# Patient Record
Sex: Male | Born: 1980 | Race: White | Hispanic: No | Marital: Married | Smoking: Never smoker
Health system: Southern US, Community
[De-identification: ages and names within clinical notes are randomized; demographics above are authoritative.]

## PROBLEM LIST (undated history)

## (undated) DIAGNOSIS — J309 Allergic rhinitis, unspecified: Secondary | ICD-10-CM

## (undated) DIAGNOSIS — L989 Disorder of the skin and subcutaneous tissue, unspecified: Secondary | ICD-10-CM

## (undated) DIAGNOSIS — L814 Other melanin hyperpigmentation: Secondary | ICD-10-CM

## (undated) DIAGNOSIS — R002 Palpitations: Secondary | ICD-10-CM

## (undated) DIAGNOSIS — K219 Gastro-esophageal reflux disease without esophagitis: Secondary | ICD-10-CM

## (undated) DIAGNOSIS — D229 Melanocytic nevi, unspecified: Secondary | ICD-10-CM

## (undated) DIAGNOSIS — I493 Ventricular premature depolarization: Secondary | ICD-10-CM

## (undated) HISTORY — PX: NASAL ENDOSCOPY: SHX286

## (undated) HISTORY — DX: Palpitations: R00.2

## (undated) HISTORY — DX: Melanocytic nevi, unspecified: D22.9

## (undated) HISTORY — DX: Disorder of the skin and subcutaneous tissue, unspecified: L98.9

## (undated) HISTORY — PX: APPENDECTOMY: SHX54

## (undated) HISTORY — PX: OTHER SURGICAL HISTORY: SHX169

## (undated) HISTORY — DX: Allergic rhinitis, unspecified: J30.9

## (undated) HISTORY — PX: WISDOM TOOTH EXTRACTION: SHX21

## (undated) HISTORY — DX: Other melanin hyperpigmentation: L81.4

## (undated) HISTORY — DX: Ventricular premature depolarization: I49.3

## (undated) HISTORY — DX: Gastro-esophageal reflux disease without esophagitis: K21.9

---

## 2003-11-08 ENCOUNTER — Encounter: Admission: RE | Admit: 2003-11-08 | Discharge: 2003-11-08 | Payer: Self-pay | Admitting: Family Medicine

## 2004-04-25 ENCOUNTER — Ambulatory Visit: Payer: Self-pay | Admitting: Family Medicine

## 2004-12-06 ENCOUNTER — Ambulatory Visit: Payer: Self-pay | Admitting: Family Medicine

## 2005-04-24 ENCOUNTER — Ambulatory Visit: Payer: Self-pay | Admitting: Family Medicine

## 2006-12-16 ENCOUNTER — Encounter: Payer: Self-pay | Admitting: Family Medicine

## 2007-12-08 ENCOUNTER — Encounter: Payer: Self-pay | Admitting: Family Medicine

## 2007-12-08 ENCOUNTER — Ambulatory Visit: Payer: Self-pay | Admitting: Family Medicine

## 2007-12-08 DIAGNOSIS — L989 Disorder of the skin and subcutaneous tissue, unspecified: Secondary | ICD-10-CM | POA: Insufficient documentation

## 2007-12-08 DIAGNOSIS — D239 Other benign neoplasm of skin, unspecified: Secondary | ICD-10-CM | POA: Insufficient documentation

## 2007-12-08 DIAGNOSIS — L819 Disorder of pigmentation, unspecified: Secondary | ICD-10-CM | POA: Insufficient documentation

## 2007-12-08 DIAGNOSIS — K219 Gastro-esophageal reflux disease without esophagitis: Secondary | ICD-10-CM | POA: Insufficient documentation

## 2010-03-20 NOTE — Assessment & Plan Note (Signed)
Summary: mole removal/mhf   Vital Signs:  Patient Profile:   30 Years Old Male Weight:      166 pounds Temp:     98.2 degrees F oral BP sitting:   102 / 72  (left arm) Cuff size:   regular  Vitals Entered By: Alfred Levins, CMA (December 08, 2007 2:56 PM)                 Chief Complaint:  2 moles removed from back.  History of Present Illness: Here for me to excise 2 moles on his back. These have been present for years but they seem to be getting a bit larger. No symptoms.     Current Allergies (reviewed today): No known allergies   Past Medical History:    Reviewed history and no changes required:       GERD       hyperhydrosis  Past Surgical History:    Reviewed history and no changes required:       Denies surgical history   Family History:    Reviewed history and no changes required:       bladder cancer  Social History:    Reviewed history and no changes required:       Married       Never Smoked       Alcohol use-no   Risk Factors:  Tobacco use:  never Alcohol use:  no   Review of Systems  The patient denies anorexia, fever, weight loss, weight gain, vision loss, decreased hearing, hoarseness, chest pain, syncope, dyspnea on exertion, peripheral edema, prolonged cough, headaches, hemoptysis, abdominal pain, melena, hematochezia, severe indigestion/heartburn, hematuria, incontinence, genital sores, muscle weakness, suspicious skin lesions, transient blindness, difficulty walking, depression, unusual weight change, abnormal bleeding, enlarged lymph nodes, angioedema, breast masses, and testicular masses.     Physical Exam  General:     Well-developed,well-nourished,in no acute distress; alert,appropriate and cooperative throughout examination Skin:     the 2 lesions in question are both in the center of the upper back. One  of these measures  about 1.8 cm in diameter and  is papular and dark brown, appears to be a nevus. The other is macular and  speckled with light brown or dark brown areas and measures about 0.9 cm in diameter.    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 Two times a day   Patient Instructions: 1)  After informed consent was obtained, these were cleansed with Betadine. LA was achieved using 2% xylocaine with epinephrine. An elliptical incision was made around both lesions down to the subcutaneous fat, using a scalpel. Both were then removed and sent to pathology. Both were closed with 3 sutures each of 4-0 Ethilon. Both were dressed with Neosporin and gauze.  He will remove the sutures in 2 weeks.   Prescriptions: RANITIDINE HCL 150 MG TABS (RANITIDINE HCL) two times a day  #60 x 11   Entered and Authorized by:   Nelwyn Salisbury MD   Signed by:   Nelwyn Salisbury MD on 12/08/2007   Method used:   Electronically to        [facility] [id number] (retail)       [phone]  [address] [place], [place]  [address]       Ph: [phone]       Fax: [phone]   RxID:   [id number]  ]

## 2010-03-20 NOTE — Miscellaneous (Signed)
Summary: Consent to Special Procedure-Mole Removal  Consent to Special Procedure-Mole Removal   Imported By: Maryln Gottron 12/14/2007 14:21:02  _____________________________________________________________________  External Attachment:    Type:   Image     Comment:   External Document

## 2011-12-17 ENCOUNTER — Other Ambulatory Visit: Payer: Self-pay | Admitting: Internal Medicine

## 2011-12-17 DIAGNOSIS — N509 Disorder of male genital organs, unspecified: Secondary | ICD-10-CM

## 2011-12-18 ENCOUNTER — Ambulatory Visit
Admission: RE | Admit: 2011-12-18 | Discharge: 2011-12-18 | Disposition: A | Discharge status: Home / Self Care | Payer: Managed Care, Other (non HMO) | Source: Ambulatory Visit | Attending: Internal Medicine | Admitting: Internal Medicine

## 2011-12-18 DIAGNOSIS — N509 Disorder of male genital organs, unspecified: Secondary | ICD-10-CM

## 2012-12-30 ENCOUNTER — Telehealth: Payer: Self-pay

## 2012-12-31 ENCOUNTER — Other Ambulatory Visit: Payer: Self-pay | Admitting: Cardiology

## 2012-12-31 MED ORDER — NEBIVOLOL HCL 5 MG PO TABS: 5.0000 mg | ORAL_TABLET | Freq: Every day | ORAL | Status: DC

## 2013-01-10 NOTE — Telephone Encounter (Signed)
Refill

## 2013-04-11 ENCOUNTER — Other Ambulatory Visit: Payer: Self-pay | Admitting: Cardiology

## 2013-04-11 MED ORDER — NEBIVOLOL HCL 5 MG PO TABS: 5.0000 mg | ORAL_TABLET | Freq: Every day | ORAL | Status: DC

## 2013-08-12 ENCOUNTER — Encounter: Payer: Self-pay | Admitting: Cardiology

## 2013-09-09 ENCOUNTER — Ambulatory Visit: Payer: Managed Care, Other (non HMO) | Admitting: Cardiology

## 2013-09-19 ENCOUNTER — Other Ambulatory Visit: Payer: Self-pay | Admitting: Dermatology

## 2013-10-13 ENCOUNTER — Encounter: Payer: Self-pay | Admitting: Cardiology

## 2013-10-13 ENCOUNTER — Ambulatory Visit (INDEPENDENT_AMBULATORY_CARE_PROVIDER_SITE_OTHER): Payer: 59 | Admitting: Cardiology

## 2013-10-13 VITALS — BP 130/78 | HR 61 | Ht 66.0 in | Wt 168.0 lb

## 2013-10-13 DIAGNOSIS — I4949 Other premature depolarization: Secondary | ICD-10-CM

## 2013-10-13 DIAGNOSIS — I493 Ventricular premature depolarization: Secondary | ICD-10-CM

## 2013-10-13 DIAGNOSIS — R002 Palpitations: Secondary | ICD-10-CM

## 2013-10-13 NOTE — Progress Notes (Signed)
      [place]. [address], [address] [place], [place]  [address] Phone: [phone] Fax:  [phone]  Date:  10/13/2013   ID:  Gabriel Frye, DOB 20-Apr-1980, MRN [id number]  PCP:  Wenda Low, MD   History of Present Illness: Gabriel Frye is a 33 y.o. male with palpitations, exercise treadmill test demonstrated PVCs, echocardiogram showing normal EF, low normal BP. He did not tolerate metoprolol well due to hypotension. Atenolol had a similar effect as well as fatigue. Bystolic 5mg , originally started at 2.5 mg , is helping significantly with symptoms. Palpitations still can occur at the onset of exercise and during recovery but he has not experienced any high-risk symptoms such as syncope during peak exercise.  08/13/11-doing very well. His palpitations have diminished. Noting them at times with heavy exertion. Bystolic 2.5 mg doing well.   09/10/12 - overall has had a set back at the beach, vagal-like, reeling in shark pulling Palpitations, pauses irratic. Felt bad. Dehydrated. ?Hyperven. Always has been under situations like this. At end of ETT, BP tanked and he was symptomatic.   10/13/13-doing well, no complaints. No palpitations. Very rarely feels that with increased stress. Mild increase shortness of breath at times. Overall doing well.    Wt Readings from Last 3 Encounters:  10/13/13 168 lb (76.204 kg)  12/08/07 166 lb (75.297 kg)     No past medical history on file.  No past surgical history on file.  Current Outpatient Prescriptions  Medication Sig Dispense Refill  . nebivolol (BYSTOLIC) 5 MG tablet Take 1 tablet (5 mg total) by mouth daily.  90 tablet  3   No current facility-administered medications for this visit.    Allergies:   No Known Allergies  Social History:  The patient  reports that he has never smoked. He does not have any smokeless tobacco history on file.   No family history on file.  ROS:  Please see the history of present illness.   Denies any  fevers, chills, orthopnea, PND   All other systems reviewed and negative.   PHYSICAL EXAM: VS:  BP 130/78  Pulse 61  Ht 5\' 6"  (1.676 m)  Wt 168 lb (76.204 kg)  BMI 27.13 kg/m2 Well nourished, well developed, in no acute distress HEENT: normal, [place]/AT, EOMI Neck: no JVD, normal carotid upstroke, no bruit Cardiac:  normal S1, S2; RRR; no murmur Lungs:  clear to auscultation bilaterally, no wheezing, rhonchi or rales Abd: soft, nontender, no hepatomegaly, no bruits Ext: no edema, 2+ distal pulses Skin: warm and dry GU: deferred Neuro: no focal abnormalities noted, AAO x 3  EKG:  10/13/13- sinus rhythm, 61, no other abnormalities, normal intervals.  ASSESSMENT AND PLAN:  1. PVCs/palpitations-overall doing well, currently on Bystolic. No change in therapy.  Signed, Candee Furbish, MD [facility]  10/13/2013 2:14 PM

## 2013-10-13 NOTE — Patient Instructions (Signed)
The current medical regimen is effective;  continue present plan and medications.  Follow up in 1 year with Dr Skains.  You will receive a letter in the mail 2 months before you are due.  Please call us when you receive this letter to schedule your follow up appointment.  

## 2013-11-10 ENCOUNTER — Other Ambulatory Visit: Payer: Self-pay | Admitting: *Deleted

## 2013-11-10 MED ORDER — NEBIVOLOL HCL 5 MG PO TABS: 5.0000 mg | ORAL_TABLET | Freq: Every day | ORAL | Status: DC

## 2014-02-03 ENCOUNTER — Other Ambulatory Visit: Payer: Self-pay | Admitting: *Deleted

## 2014-02-03 MED ORDER — NEBIVOLOL HCL 5 MG PO TABS: 5.0000 mg | ORAL_TABLET | Freq: Every day | ORAL | Status: DC

## 2014-10-25 DIAGNOSIS — L989 Disorder of the skin and subcutaneous tissue, unspecified: Secondary | ICD-10-CM | POA: Insufficient documentation

## 2014-10-25 DIAGNOSIS — L814 Other melanin hyperpigmentation: Secondary | ICD-10-CM | POA: Insufficient documentation

## 2014-10-25 DIAGNOSIS — K219 Gastro-esophageal reflux disease without esophagitis: Secondary | ICD-10-CM | POA: Insufficient documentation

## 2014-10-25 DIAGNOSIS — J309 Allergic rhinitis, unspecified: Secondary | ICD-10-CM | POA: Insufficient documentation

## 2014-10-25 DIAGNOSIS — D229 Melanocytic nevi, unspecified: Secondary | ICD-10-CM | POA: Insufficient documentation

## 2014-10-25 DIAGNOSIS — I493 Ventricular premature depolarization: Secondary | ICD-10-CM | POA: Insufficient documentation

## 2014-10-25 DIAGNOSIS — R002 Palpitations: Secondary | ICD-10-CM | POA: Insufficient documentation

## 2014-10-27 ENCOUNTER — Ambulatory Visit (INDEPENDENT_AMBULATORY_CARE_PROVIDER_SITE_OTHER): Payer: BLUE CROSS/BLUE SHIELD | Admitting: Cardiology

## 2014-10-27 ENCOUNTER — Encounter: Payer: Self-pay | Admitting: Cardiology

## 2014-10-27 VITALS — BP 110/70 | HR 53 | Ht 66.0 in | Wt 163.0 lb

## 2014-10-27 DIAGNOSIS — I493 Ventricular premature depolarization: Secondary | ICD-10-CM | POA: Insufficient documentation

## 2014-10-27 DIAGNOSIS — R002 Palpitations: Secondary | ICD-10-CM | POA: Diagnosis not present

## 2014-10-27 NOTE — Progress Notes (Signed)
[place]. [address], [address] [place], [place]  [address] Phone: [phone] Fax:  [phone]  Date:  10/27/2014   ID:  Gabriel Frye, DOB 1980-03-29, MRN [id number]  PCP:  Wenda Low, MD   History of Present Illness: Gabriel Frye is a 34 y.o. male with palpitations, exercise treadmill test demonstrated PVCs, echocardiogram showing normal EF, low normal BP. He did not tolerate metoprolol well due to hypotension. Atenolol had a similar effect as well as fatigue. Bystolic 5mg , originally started at 2.5 mg , is helping significantly with symptoms. Palpitations still can occur at the onset of exercise and during recovery but he has not experienced any high-risk symptoms such as syncope during peak exercise.  08/13/11-doing very well. His palpitations have diminished. Noting them at times with heavy exertion. Bystolic 2.5 mg doing well.   09/10/12 - overall has had a set back at the beach, vagal-like, reeling in shark pulling Palpitations, pauses irratic. Felt bad. Dehydrated. ?Hyperven. Always has been under situations like this. At end of ETT, BP tanked and he was symptomatic.   10/13/13-doing well, no complaints. No palpitations. Very rarely feels that with increased stress. Mild increase shortness of breath at times. Overall doing well.   10/27/14-Quad pain, labrum, weight loss. Not feeling it with exercise. PVC when sitting. Overall doing well. About to go on trip to [place].  Wt Readings from Last 3 Encounters:  10/27/14 163 lb (73.936 kg)  10/13/13 168 lb (76.204 kg)  12/08/07 166 lb (75.297 kg)     Past Medical History  Diagnosis Date  . GERD (gastroesophageal reflux disease)   . Lentigo   . Atypical nevus   . Skin lesions   . Palpitations   . PVC (premature ventricular contraction)   . Allergic rhinitis     No past surgical history on file.  Current Outpatient Prescriptions  Medication Sig Dispense Refill  . nebivolol (BYSTOLIC) 5 MG tablet Take 1 tablet (5 mg  total) by mouth daily. 90 tablet 3   No current facility-administered medications for this visit.    Allergies:   No Known Allergies  Social History:  The patient  reports that he has never smoked. He does not have any smokeless tobacco history on file.   Family History  Problem Relation Age of Onset  . Cancer - Colon Father 51  . Heart attack Maternal Grandfather 1    D/C  . CAD Maternal Grandfather   . Heart disease Paternal Grandfather   . CAD Paternal Grandfather   . Epilepsy Father     ROS:  Please see the history of present illness.   Denies any fevers, chills, orthopnea, PND   All other systems reviewed and negative.   PHYSICAL EXAM: VS:  BP 110/70 mmHg  Pulse 53  Ht 5\' 6"  (1.676 m)  Wt 163 lb (73.936 kg)  BMI 26.32 kg/m2 Well nourished, well developed, in no acute distress HEENT: normal, [place]/AT, EOMI Neck: no JVD, normal carotid upstroke, no bruit Cardiac:  normal S1, S2; RRR; no murmur Lungs:  clear to auscultation bilaterally, no wheezing, rhonchi or rales Abd: soft, nontender, no hepatomegaly, no bruits Ext: no edema, 2+ distal pulses Skin: warm and dry GU: deferred Neuro: no focal abnormalities noted, AAO x 3  EKG: Today 10/27/14-sinus bradycardia rate 53 with no other abnormalities. Personally viewed 10/13/13- sinus rhythm, 61, no other abnormalities, normal intervals.  ASSESSMENT AND PLAN:  1. PVCs/palpitations-overall doing well, currently on Bystolic. No change in  therapy.  Signed, Candee Furbish, MD [facility]  10/27/2014 10:02 AM

## 2014-10-27 NOTE — Patient Instructions (Signed)
Medication Instructions:  The current medical regimen is effective;  continue present plan and medications.  Follow-Up: Follow up in 1 year with Dr. Skains.  You will receive a letter in the mail 2 months before you are due.  Please call us when you receive this letter to schedule your follow up appointment.  Thank you for choosing [facility]!!     

## 2014-10-30 ENCOUNTER — Encounter: Payer: Self-pay | Admitting: Cardiology

## 2014-11-08 ENCOUNTER — Encounter: Payer: Self-pay | Admitting: Cardiology

## 2014-11-14 NOTE — Addendum Note (Signed)
Addended by: Freada Bergeron on: 11/14/2014 03:09 PM   Modules accepted: Orders

## 2015-02-18 ENCOUNTER — Other Ambulatory Visit: Payer: Self-pay | Admitting: Cardiology

## 2015-05-11 ENCOUNTER — Other Ambulatory Visit: Payer: Self-pay | Admitting: *Deleted

## 2015-05-11 ENCOUNTER — Telehealth: Payer: Self-pay | Admitting: *Deleted

## 2015-05-11 MED ORDER — OSELTAMIVIR PHOSPHATE 75 MG PO CAPS: 75.0000 mg | ORAL_CAPSULE | Freq: Every day | ORAL | Status: DC

## 2015-05-11 NOTE — Telephone Encounter (Signed)
Pt called in and spoke with Dr Marlou Porch.  He is running a fever ot 102, aching and h/a.  Has had sick contacts with influenza A.  Rx sent into pharmacy for Tamiflu 75 mg a day for 10 days as ordered by Dr Marlou Porch.

## 2015-11-08 DIAGNOSIS — Z0000 Encounter for general adult medical examination without abnormal findings: Secondary | ICD-10-CM | POA: Diagnosis not present

## 2015-11-08 DIAGNOSIS — Z23 Encounter for immunization: Secondary | ICD-10-CM | POA: Diagnosis not present

## 2015-11-08 DIAGNOSIS — Z1389 Encounter for screening for other disorder: Secondary | ICD-10-CM | POA: Diagnosis not present

## 2015-11-12 ENCOUNTER — Ambulatory Visit (INDEPENDENT_AMBULATORY_CARE_PROVIDER_SITE_OTHER): Payer: BLUE CROSS/BLUE SHIELD | Admitting: Cardiology

## 2015-11-12 ENCOUNTER — Encounter (INDEPENDENT_AMBULATORY_CARE_PROVIDER_SITE_OTHER): Payer: Self-pay

## 2015-11-12 ENCOUNTER — Encounter: Payer: Self-pay | Admitting: Cardiology

## 2015-11-12 VITALS — BP 118/80 | HR 58 | Ht 66.0 in | Wt 169.0 lb

## 2015-11-12 DIAGNOSIS — R002 Palpitations: Secondary | ICD-10-CM

## 2015-11-12 DIAGNOSIS — I493 Ventricular premature depolarization: Secondary | ICD-10-CM

## 2015-11-12 MED ORDER — NEBIVOLOL HCL 5 MG PO TABS: 5.0000 mg | ORAL_TABLET | Freq: Every day | ORAL | 11 refills | Status: DC

## 2015-11-12 NOTE — Progress Notes (Signed)
[place]. [address] [place], [place]  [address] Phone: [phone] Fax:  [phone]  Date:  11/12/2015   ID:  Gabriel Frye, DOB Mar 31, 1980, MRN [id number]  PCP:  Wenda Low, MD   History of Present Illness: Gabriel Frye is a 35 y.o. male with palpitations, exercise treadmill test demonstrated PVCs, echocardiogram showing normal EF, low normal BP. He did not tolerate metoprolol well due to hypotension. Atenolol had a similar effect as well as fatigue. Bystolic 5mg , originally started at 2.5 mg , is helping significantly with symptoms. Palpitations still can occur at the onset of exercise and during recovery but he has not experienced any high-risk symptoms such as syncope during peak exercise.  08/13/11-doing very well. His palpitations have diminished. Noting them at times with heavy exertion. Bystolic 2.5 mg doing well.   09/10/12 - overall has had a set back at the beach, vagal-like, reeling in shark pulling Palpitations, pauses irratic. Felt bad. Dehydrated. ?Hyperven. Always has been under situations like this. At end of ETT, BP tanked and he was symptomatic.   10/13/13-doing well, no complaints. No palpitations. Very rarely feels that with increased stress. Mild increase shortness of breath at times. Overall doing well.   10/27/14-Quad pain, labrum, weight loss. Not feeling it with exercise. PVC when sitting. Overall doing well. About to go on trip to [place].  11/12/15-overall doing well. Enjoyed [place], [employer]. He is going to do the transit clinic from [place] to [place]. No significant palpitations. Occasionally cyclical as discussed before. No syncope, no bleeding, no orthopnea, no PND. The proximal with his medication.  Wt Readings from Last 3 Encounters:  11/12/15 169 lb (76.7 kg)  10/27/14 163 lb (73.9 kg)  10/13/13 168 lb (76.2 kg)     Past Medical History:  Diagnosis Date  . Allergic rhinitis   . Atypical nevus   . GERD (gastroesophageal reflux  disease)   . Lentigo   . Palpitations   . PVC (premature ventricular contraction)   . Skin lesions     No past surgical history on file.  Current Outpatient Prescriptions  Medication Sig Dispense Refill  . BYSTOLIC 5 MG tablet TAKE 1 TABLET (5 MG TOTAL) BY MOUTH DAILY. 90 tablet 2   No current facility-administered medications for this visit.     Allergies:   No Known Allergies  Social History:  The patient  reports that he has never smoked. He does not have any smokeless tobacco history on file.   Family History  Problem Relation Age of Onset  . Cancer - Colon Father 82  . Heart attack Maternal Grandfather 54    D/C  . CAD Maternal Grandfather   . Heart disease Paternal Grandfather   . CAD Paternal Grandfather   . Epilepsy Father     ROS:  Please see the history of present illness.   Denies any fevers, chills, orthopnea, PND   All other systems reviewed and negative.   PHYSICAL EXAM: VS:  BP 118/80   Pulse (!) 58   Ht 5\' 6"  (1.676 m)   Wt 169 lb (76.7 kg)   BMI 27.28 kg/m  Well nourished, well developed, in no acute distress  HEENT: normal, [place]/AT, EOMI Neck: no JVD, normal carotid upstroke, no bruit Cardiac:  normal S1, S2; RRR; no murmur  Lungs:  clear to auscultation bilaterally, no wheezing, rhonchi or rales  Abd: soft, nontender, no hepatomegaly, no bruits  Ext: no edema, 2+ distal pulses Skin:  warm and dry  GU: deferred Neuro: no focal abnormalities noted, AAO x 3  EKG: Today 11/12/15-sinus bradycardia rate 58 with no other abnormalities. 10/27/14-sinus bradycardia rate 53 with no other abnormalities. Personally viewed 10/13/13- sinus rhythm, 61, no other abnormalities, normal intervals.  LDL 77, Tc 151, HdL 43, Tr 143. Gl 91 ASSESSMENT AND PLAN:  1. PVCs/palpitations-overall doing well, currently on Bystolic. No change in therapy. No chest pain, no syncope, no bleeding, no orthopnea.  Signed, Candee Furbish, MD [facility]  11/12/2015 9:34 AM

## 2015-11-12 NOTE — Patient Instructions (Signed)

## 2016-04-11 DIAGNOSIS — R509 Fever, unspecified: Secondary | ICD-10-CM | POA: Diagnosis not present

## 2016-04-11 DIAGNOSIS — R6883 Chills (without fever): Secondary | ICD-10-CM | POA: Diagnosis not present

## 2016-04-11 DIAGNOSIS — J029 Acute pharyngitis, unspecified: Secondary | ICD-10-CM | POA: Diagnosis not present

## 2016-11-10 ENCOUNTER — Other Ambulatory Visit: Payer: Self-pay | Admitting: Internal Medicine

## 2016-11-10 DIAGNOSIS — I493 Ventricular premature depolarization: Secondary | ICD-10-CM | POA: Diagnosis not present

## 2016-11-10 DIAGNOSIS — R1310 Dysphagia, unspecified: Secondary | ICD-10-CM | POA: Diagnosis not present

## 2016-11-10 DIAGNOSIS — Z23 Encounter for immunization: Secondary | ICD-10-CM | POA: Diagnosis not present

## 2016-11-10 DIAGNOSIS — Z0000 Encounter for general adult medical examination without abnormal findings: Secondary | ICD-10-CM | POA: Diagnosis not present

## 2016-11-10 DIAGNOSIS — J309 Allergic rhinitis, unspecified: Secondary | ICD-10-CM | POA: Diagnosis not present

## 2016-11-19 ENCOUNTER — Other Ambulatory Visit: Payer: BLUE CROSS/BLUE SHIELD

## 2016-11-20 ENCOUNTER — Ambulatory Visit
Admission: RE | Admit: 2016-11-20 | Discharge: 2016-11-20 | Disposition: A | Discharge status: Home / Self Care | Payer: BLUE CROSS/BLUE SHIELD | Source: Ambulatory Visit | Attending: Internal Medicine | Admitting: Internal Medicine

## 2016-11-20 DIAGNOSIS — R1310 Dysphagia, unspecified: Secondary | ICD-10-CM | POA: Diagnosis not present

## 2016-12-07 ENCOUNTER — Other Ambulatory Visit: Payer: Self-pay | Admitting: Cardiology

## 2016-12-15 ENCOUNTER — Other Ambulatory Visit: Payer: Self-pay | Admitting: Gastroenterology

## 2016-12-15 DIAGNOSIS — R1310 Dysphagia, unspecified: Secondary | ICD-10-CM | POA: Diagnosis not present

## 2016-12-30 ENCOUNTER — Ambulatory Visit: Payer: BLUE CROSS/BLUE SHIELD | Admitting: Cardiology

## 2017-01-26 ENCOUNTER — Encounter (HOSPITAL_COMMUNITY): Payer: Self-pay | Admitting: Emergency Medicine

## 2017-01-27 ENCOUNTER — Other Ambulatory Visit: Payer: Self-pay

## 2017-01-27 ENCOUNTER — Encounter (HOSPITAL_COMMUNITY): Payer: Self-pay | Admitting: Emergency Medicine

## 2017-01-28 ENCOUNTER — Encounter: Payer: Self-pay | Admitting: Cardiology

## 2017-01-30 ENCOUNTER — Ambulatory Visit: Payer: BLUE CROSS/BLUE SHIELD | Admitting: Cardiology

## 2017-02-02 ENCOUNTER — Other Ambulatory Visit: Payer: Self-pay | Admitting: Cardiology

## 2017-02-04 ENCOUNTER — Other Ambulatory Visit: Payer: Self-pay

## 2017-02-04 ENCOUNTER — Ambulatory Visit (HOSPITAL_COMMUNITY)
Admission: RE | Admit: 2017-02-04 | Discharge: 2017-02-04 | Disposition: A | Discharge status: Home / Self Care | Payer: BLUE CROSS/BLUE SHIELD | Source: Ambulatory Visit | Attending: Gastroenterology | Admitting: Gastroenterology

## 2017-02-04 ENCOUNTER — Ambulatory Visit (HOSPITAL_COMMUNITY): Payer: BLUE CROSS/BLUE SHIELD | Admitting: Anesthesiology

## 2017-02-04 ENCOUNTER — Encounter (HOSPITAL_COMMUNITY): Payer: Self-pay

## 2017-02-04 ENCOUNTER — Encounter (HOSPITAL_COMMUNITY)
Admission: RE | Disposition: A | Discharge status: Home / Self Care | Payer: Self-pay | Source: Ambulatory Visit | Attending: Gastroenterology

## 2017-02-04 DIAGNOSIS — K228 Other specified diseases of esophagus: Secondary | ICD-10-CM | POA: Diagnosis not present

## 2017-02-04 DIAGNOSIS — R1310 Dysphagia, unspecified: Secondary | ICD-10-CM | POA: Insufficient documentation

## 2017-02-04 DIAGNOSIS — K209 Esophagitis, unspecified: Secondary | ICD-10-CM | POA: Insufficient documentation

## 2017-02-04 DIAGNOSIS — K222 Esophageal obstruction: Secondary | ICD-10-CM | POA: Insufficient documentation

## 2017-02-04 DIAGNOSIS — K269 Duodenal ulcer, unspecified as acute or chronic, without hemorrhage or perforation: Secondary | ICD-10-CM | POA: Insufficient documentation

## 2017-02-04 HISTORY — PX: ESOPHAGOGASTRODUODENOSCOPY (EGD) WITH PROPOFOL: SHX5813

## 2017-02-04 SURGERY — ESOPHAGOGASTRODUODENOSCOPY (EGD) WITH PROPOFOL
Anesthesia: Monitor Anesthesia Care

## 2017-02-04 MED ORDER — LIDOCAINE 2% (20 MG/ML) 5 ML SYRINGE
INTRAMUSCULAR | Status: DC | PRN
  Administered 2017-02-04: 100 mg via INTRAVENOUS

## 2017-02-04 MED ORDER — GLYCOPYRROLATE 0.2 MG/ML IJ SOLN
INTRAMUSCULAR | Status: DC | PRN
  Administered 2017-02-04: 0.1 mg via INTRAVENOUS

## 2017-02-04 MED ORDER — PROPOFOL 10 MG/ML IV BOLUS
INTRAVENOUS | Status: AC
  Filled 2017-02-04: qty 40

## 2017-02-04 MED ORDER — SODIUM CHLORIDE 0.9 % IV SOLN: INTRAVENOUS | Status: DC

## 2017-02-04 MED ORDER — PROPOFOL 10 MG/ML IV BOLUS
INTRAVENOUS | Status: DC | PRN
  Administered 2017-02-04: 20 mg via INTRAVENOUS
  Administered 2017-02-04: 50 mg via INTRAVENOUS
  Administered 2017-02-04: 20 mg via INTRAVENOUS

## 2017-02-04 MED ORDER — LACTATED RINGERS IV SOLN
INTRAVENOUS | Status: DC
  Administered 2017-02-04: 09:00:00 via INTRAVENOUS

## 2017-02-04 MED ORDER — PROPOFOL 500 MG/50ML IV EMUL
INTRAVENOUS | Status: DC | PRN
  Administered 2017-02-04: 150 ug/kg/min via INTRAVENOUS

## 2017-02-04 SURGICAL SUPPLY — 15 items

## 2017-02-04 NOTE — Op Note (Signed)
[facility] Patient Name: Gabriel Frye Procedure Date: 02/04/2017 MRN: [id number] Attending MD: Arta Silence , MD Date of Birth: May 24, 1980 CSN: [id number] Age: 36 Admit Type: Outpatient Procedure:                Upper GI endoscopy Indications:              Dysphagia, Abnormal cine-esophagram Providers:                Arta Silence, MD, Elmer Ramp. Tilden Dome, RN, Elspeth Cho Tech., Technician, Heide Scales, CRNA Referring MD:              Medicines:                Monitored Anesthesia Care Complications:            No immediate complications. Estimated Blood Loss:     Estimated blood loss was minimal. Procedure:                Pre-Anesthesia Assessment:                           - Prior to the procedure, a History and Physical                            was performed, and patient medications and                            allergies were reviewed. The patient's tolerance of                            previous anesthesia was also reviewed. The risks                            and benefits of the procedure and the sedation                            options and risks were discussed with the patient.                            All questions were answered, and informed consent                            was obtained. Prior Anticoagulants: The patient has                            taken no previous anticoagulant or antiplatelet                            agents. ASA Grade Assessment: II - A patient with                            mild systemic disease. After reviewing the risks  and benefits, the patient was deemed in                            satisfactory condition to undergo the procedure.                           After obtaining informed consent, the endoscope was                            passed under direct vision. Throughout the                            procedure, the patient's blood pressure, pulse, and                         oxygen saturations were monitored continuously. The                            [id number]) scope was introduced through the                            mouth, and advanced to the second part of duodenum.                            The upper GI endoscopy was accomplished without                            difficulty. The patient tolerated the procedure                            well. Scope In: Scope Out: Findings:      A few mild benign-appearing, intrinsic stenoses were found in proximal       esophagus.      Mucosal changes including ringed esophagus, feline appearance and       longitudinal furrows were found in the mid esophagus and in the distal       esophagus. Biopsies were taken with a cold forceps for histology.      The exam of the esophagus was otherwise normal.      The entire examined stomach was normal.      A single localized erosion without bleeding was found in the duodenal       bulb.      The exam of the duodenum was otherwise normal. Impression:               - Benign-appearing esophageal stenoses, webs, in                            proximal esophagus.                           - Esophageal mucosal changes suspicious for                            eosinophilic esophagitis. Biopsied.                           -  Normal stomach.                           - Duodenal erosion without bleeding. Moderate Sedation:      None Recommendation:           - Patient has a contact number available for                            emergencies. The signs and symptoms of potential                            delayed complications were discussed with the                            patient. Return to normal activities tomorrow.                            Written discharge instructions were provided to the                            patient.                           - Discharge patient to home (via wheelchair).                           - Resume previous  diet today.                           - Continue present medications.                           - Await pathology results.                           - Return to GI clinic after studies are complete.                           - Return to referring physician as previously                            scheduled. Procedure Code(s):        --- Professional ---                           [phone], Esophagogastroduodenoscopy, flexible,                            transoral; with biopsy, single or multiple Diagnosis Code(s):        --- Professional ---                           K22.2, Esophageal obstruction                           K20.9, Esophagitis, unspecified  K26.9, Duodenal ulcer, unspecified as acute or                            chronic, without hemorrhage or perforation                           R13.10, Dysphagia, unspecified                           R93.3, Abnormal findings on diagnostic imaging of                            other parts of digestive tract CPT copyright 2016 American Medical Association. All rights reserved. The codes documented in this report are preliminary and upon coder review may  be revised to meet current compliance requirements. Arta Silence, MD 02/04/2017 9:58:18 AM This report has been signed electronically. Number of Addenda: 0

## 2017-02-04 NOTE — H&P (Signed)
Patient interval history reviewed.  Patient examined again.  There has been no change from documented H/P dated 02/03/17 (scanned into chart from our office) except as documented above.  Assessment:  1.  Dysphagia. 2.  Abnormal barium esophagram.  Plan:  1.  Endoscopy with possible dilatation or possible biopsies. 2.  Risks (bleeding, infection, bowel perforation that could require surgery, sedation-related changes in cardiopulmonary systems), benefits (identification and possible treatment of source of symptoms, exclusion of certain causes of symptoms), and alternatives (watchful waiting, radiographic imaging studies, empiric medical treatment) of upper endoscopy with possible dilatation (EGD +/- DIL) were explained to patient/family in detail and patient wishes to proceed.

## 2017-02-04 NOTE — Anesthesia Postprocedure Evaluation (Signed)
Anesthesia Post Note  Patient: Gabriel Frye  Procedure(s) Performed: ESOPHAGOGASTRODUODENOSCOPY (EGD) WITH PROPOFOL (N/A ) BALLOON DILATION (N/A )     Patient location during evaluation: PACU Anesthesia Type: MAC Level of consciousness: awake and alert Pain management: pain level controlled Vital Signs Assessment: post-procedure vital signs reviewed and stable Respiratory status: spontaneous breathing, nonlabored ventilation, respiratory function stable and patient connected to nasal cannula oxygen Cardiovascular status: stable and blood pressure returned to baseline Postop Assessment: no apparent nausea or vomiting Anesthetic complications: no    Last Vitals:  Vitals:   02/04/17 0954 02/04/17 1000  BP: (!) 90/43 (!) 97/50  Pulse: (!) 51 (!) 51  Resp: 13 19  Temp:    SpO2: 100% 100%    Last Pain:  Vitals:   02/04/17 0859  TempSrc: Oral                 Reyonna Haack S

## 2017-02-04 NOTE — Transfer of Care (Signed)
Immediate Anesthesia Transfer of Care Note  Patient: Gabriel Frye  Procedure(s) Performed: Procedure(s): ESOPHAGOGASTRODUODENOSCOPY (EGD) WITH PROPOFOL (N/A) BALLOON DILATION (N/A)  Patient Location: PACU  Anesthesia Type:MAC  Level of Consciousness: Patient easily awoken, sedated, comfortable, cooperative, following commands, responds to stimulation.   Airway & Oxygen Therapy: Patient spontaneously breathing, ventilating well, oxygen via simple oxygen mask.  Post-op Assessment: Report given to PACU RN, vital signs reviewed and stable, moving all extremities.   Post vital signs: Reviewed and stable.  Complications: No apparent anesthesia complications  Last Vitals:  Vitals:   02/04/17 0859  BP: (!) 112/56  Pulse: (!) 54  Resp: 13  Temp: 36.5 C    Last Pain:  Vitals:   02/04/17 0859  TempSrc: Oral         Complications: No apparent anesthesia complications

## 2017-02-04 NOTE — Anesthesia Preprocedure Evaluation (Signed)
Anesthesia Evaluation  Patient identified by MRN, date of birth, ID band Patient awake    Reviewed: Allergy & Precautions, NPO status , Patient's Chart, lab work & pertinent test results  Airway Mallampati: II  TM Distance: >3 FB Neck ROM: Full    Dental no notable dental hx.    Pulmonary neg pulmonary ROS,    Pulmonary exam normal breath sounds clear to auscultation       Cardiovascular negative cardio ROS Normal cardiovascular exam Rhythm:Regular Rate:Normal     Neuro/Psych negative neurological ROS  negative psych ROS   GI/Hepatic Neg liver ROS, GERD  ,  Endo/Other  negative endocrine ROS  Renal/GU negative Renal ROS  negative genitourinary   Musculoskeletal negative musculoskeletal ROS (+)   Abdominal   Peds negative pediatric ROS (+)  Hematology negative hematology ROS (+)   Anesthesia Other Findings   Reproductive/Obstetrics negative OB ROS                            Anesthesia Physical Anesthesia Plan  ASA: II  Anesthesia Plan: MAC   Post-op Pain Management:    Induction: Intravenous  PONV Risk Score and Plan: 0  Airway Management Planned: Nasal Cannula  Additional Equipment:   Intra-op Plan:   Post-operative Plan:   Informed Consent: I have reviewed the patients History and Physical, chart, labs and discussed the procedure including the risks, benefits and alternatives for the proposed anesthesia with the patient or authorized representative who has indicated his/her understanding and acceptance.   Dental advisory given  Plan Discussed with: CRNA and Surgeon  Anesthesia Plan Comments:         Anesthesia Quick Evaluation

## 2017-02-04 NOTE — Anesthesia Procedure Notes (Signed)
Procedure Name: MAC Date/Time: 02/04/2017 9:40 AM Performed by: Deliah Boston, CRNA Pre-anesthesia Checklist: Patient identified, Emergency Drugs available, Suction available, Patient being monitored and Timeout performed Patient Re-evaluated:Patient Re-evaluated prior to induction Oxygen Delivery Method: Simple face mask Preoxygenation: Pre-oxygenation with 100% oxygen Placement Confirmation: positive ETCO2 and breath sounds checked- equal and bilateral

## 2017-02-04 NOTE — Discharge Instructions (Signed)
Esophagogastroduodenoscopy, Care After °Refer to this sheet in the next few weeks. These instructions provide you with information about caring for yourself after your procedure. Your health care provider may also give you more specific instructions. Your treatment has been planned according to current medical practices, but problems sometimes occur. Call your health care provider if you have any problems or questions after your procedure. °What can I expect after the procedure? °After the procedure, it is common to have: °· A sore throat. °· Nausea. °· Bloating. °· Dizziness. °· Fatigue. ° °Follow these instructions at home: °· Do not eat or drink anything until the numbing medicine (local anesthetic) has worn off and your gag reflex has returned. You will know that the local anesthetic has worn off when you can swallow comfortably. °· Do not drive for 24 hours if you received a medicine to help you relax (sedative). °· If your health care provider took a tissue sample for testing during the procedure, make sure to get your test results. This is your responsibility. Ask your health care provider or the department performing the test when your results will be ready. °· Keep all follow-up visits as told by your health care provider. This is important. °Contact a health care provider if: °· You cannot stop coughing. °· You are not urinating. °· You are urinating less than usual. °Get help right away if: °· You have trouble swallowing. °· You cannot eat or drink. °· You have throat or chest pain that gets worse. °· You are dizzy or light-headed. °· You faint. °· You have nausea or vomiting. °· You have chills. °· You have a fever. °· You have severe abdominal pain. °· You have black, tarry, or bloody stools. °This information is not intended to replace advice given to you by your health care provider. Make sure you discuss any questions you have with your health care provider. °Document Released: 01/21/2012 Document  Revised: 07/12/2015 Document Reviewed: 12/28/2014 °Elsevier Interactive Patient Education © 2018 [employer] ° °

## 2017-02-08 ENCOUNTER — Encounter (HOSPITAL_COMMUNITY): Payer: Self-pay | Admitting: Gastroenterology

## 2017-03-05 ENCOUNTER — Telehealth: Payer: Self-pay | Admitting: Allergy and Immunology

## 2017-03-05 DIAGNOSIS — K219 Gastro-esophageal reflux disease without esophagitis: Secondary | ICD-10-CM | POA: Diagnosis not present

## 2017-03-05 DIAGNOSIS — K200 Eosinophilic esophagitis: Secondary | ICD-10-CM | POA: Diagnosis not present

## 2017-03-05 NOTE — Telephone Encounter (Signed)
Patient called and was last seen in 2011 by Dr. Neldon Mc. He has been to his GI doctor and they said her needs to come here for eosinophilic esophagitis testing. He said for a food. I asked what food, he did not know and said it should be in our records. He would like a call back to tell him about this testing. I offered to put him in as a NP and have scratch testing and he said he didn't need that, possibly just blood testing.

## 2017-03-05 NOTE — Telephone Encounter (Signed)
Called patient Left voicemail to call the office back to set up an appointment.

## 2017-03-05 NOTE — Telephone Encounter (Signed)
Please advise patient that he has not been seen since 2011 and would need a new patient appointment. At that visit he would discuss with doctor what is the best option.

## 2017-03-11 ENCOUNTER — Other Ambulatory Visit: Payer: Self-pay | Admitting: Cardiology

## 2017-03-18 ENCOUNTER — Encounter: Payer: Self-pay | Admitting: Cardiology

## 2017-03-18 ENCOUNTER — Ambulatory Visit: Payer: BLUE CROSS/BLUE SHIELD | Admitting: Cardiology

## 2017-03-18 VITALS — BP 112/66 | HR 47 | Ht 66.0 in | Wt 169.0 lb

## 2017-03-18 DIAGNOSIS — I493 Ventricular premature depolarization: Secondary | ICD-10-CM

## 2017-03-18 DIAGNOSIS — R002 Palpitations: Secondary | ICD-10-CM | POA: Diagnosis not present

## 2017-03-18 MED ORDER — NEBIVOLOL HCL 5 MG PO TABS: ORAL_TABLET | ORAL | 11 refills | Status: DC

## 2017-03-18 NOTE — Progress Notes (Signed)
[facility]. [address], [address] [place], [place]  [address] Phone: [phone] Fax:  [phone]  Date:  03/18/2017   ID:  Gabriel Frye, DOB [birth year], MRN [id number]  PCP:  Gabriel Low, MD   History of Present Illness: Gabriel Frye is a 37 y.o. male with palpitations, exercise treadmill test demonstrated PVCs, echocardiogram showing normal EF, Frye normal BP. He did not tolerate metoprolol well due to hypotension. Atenolol had a similar effect as well as fatigue. Bystolic 5mg , originally started at 2.5 mg , is helping significantly with symptoms. Palpitations still can occur at the onset of exercise and during recovery but he has not experienced any high-risk symptoms such as syncope during peak exercise.  08/13/11-doing very well. His palpitations have diminished. Noting them at times with heavy exertion. Bystolic 2.5 mg doing well.   09/10/12 - overall has had a set back at the beach, vagal-like, reeling in shark pulling Palpitations, pauses irratic. Felt bad. Dehydrated. ?Hyperven. Always has been under situations like this. At end of ETT, BP tanked and he was symptomatic.   10/13/13-doing well, no complaints. No palpitations. Very rarely feels that with increased stress. Mild increase shortness of breath at times. Overall doing well.   10/27/14-Quad pain, labrum, weight loss. Not feeling it with exercise. PVC when sitting. Overall doing well. About to go on trip to [place].  11/12/15-overall doing well. Enjoyed [place], [employer]. No significant palpitations. Occasionally cyclical as discussed before. No syncope, no bleeding, no orthopnea, no PND. The proximal with his medication.  03/18/17 -overall doing very well, no significant palpitations.  Like clockwork, sometimes in September or October he may feel a few PVCs.  May be 10 a day.  It is quite rare.  He also had had some dysphasia recently and had endoscopy performed which showed eosinophilic esophagitis possibly.  He has  eliminated soy from his diet and is going to see an allergist.  Overall this is doing okay.  No chest pain fevers chills nausea vomiting syncope.  Going on [place] cruise.  Wt Readings from Last 3 Encounters:  03/18/17 169 lb (76.7 kg)  02/04/17 169 lb (76.7 kg)  11/12/15 169 lb (76.7 kg)     Past Medical History:  Diagnosis Date  . Allergic rhinitis   . Atypical nevus   . GERD (gastroesophageal reflux disease)   . Lentigo   . Palpitations   . PVC (premature ventricular contraction)   . Skin lesions     Past Surgical History:  Procedure Laterality Date  . ESOPHAGOGASTRODUODENOSCOPY (EGD) WITH PROPOFOL N/A 02/04/2017   Procedure: ESOPHAGOGASTRODUODENOSCOPY (EGD) WITH PROPOFOL;  Surgeon: Arta Silence, MD;  Location: [facility];  Service: Endoscopy;  Laterality: N/A;    Current Outpatient Medications  Medication Sig Dispense Refill  . FLOVENT HFA 220 MCG/ACT inhaler Place 2 sprays into both nostrils 2 (two) times daily.    Marland Kitchen ibuprofen (ADVIL,MOTRIN) 200 MG tablet Take 400-600 mg by mouth every 6 (six) hours as needed for headache or moderate pain.    . nebivolol (BYSTOLIC) 5 MG tablet TAKE 1 TABLET BY MOUTH DAILY. 30 tablet 11   No current facility-administered medications for this visit.     Allergies:   No Known Allergies  Social History:  The patient  reports that  has never smoked. he has never used smokeless tobacco. He reports that he does not use drugs.   Family History  Problem Relation Age of Onset  . Cancer - Colon Father  80  . Epilepsy Father   . Heart attack Maternal Grandfather 29       D/C  . CAD Maternal Grandfather   . Heart disease Paternal Grandfather   . CAD Paternal Grandfather     ROS:  Please see the history of present illness.   Denies any fevers, chills, orthopnea, PND   All other systems reviewed and negative.   PHYSICAL EXAM: VS:  BP 112/66   Pulse (!) 47   Ht 5\' 6"  (1.676 m)   Wt 169 lb (76.7 kg)   BMI 27.28 kg/m  GEN: Well  nourished, well developed, in no acute distress  HEENT: normal  Neck: no JVD, carotid bruits, or masses Cardiac: RRR; no murmurs, rubs, or gallops,no edema  Respiratory:  clear to auscultation bilaterally, normal work of breathing GI: soft, nontender, nondistended, + BS MS: no deformity or atrophy  Skin: warm and dry, no rash Neuro:  Alert and Oriented x 3, Strength and sensation are intact Psych: euthymic mood, full affect  EKG: Today 03/18/17 - SB 47, no other abn. Personally viewed. 11/12/15-sinus bradycardia rate 58 with no other abnormalities. 10/27/14-sinus bradycardia rate 53 with no other abnormalities. Personally viewed 10/13/13- sinus rhythm, 61, no other abnormalities, normal intervals.  Echocardiogram 12/21/2008-normal EF, trace MR.  LDL 77, Tc 151, HdL 43, Tr 143. Gl 91 ASSESSMENT AND PLAN:  PVCs/palpitations-overall doing well, currently on Bystolic.  No changes in therapy needed.  Continue with diet, exercise.  Signed, Gabriel Furbish, MD [facility]  03/18/2017 9:39 AM

## 2017-03-18 NOTE — Patient Instructions (Signed)

## 2017-03-19 NOTE — Addendum Note (Signed)
Addended by: Velna Ochs on: 03/19/2017 05:33 PM   Modules accepted: Orders

## 2017-03-25 ENCOUNTER — Ambulatory Visit: Payer: Self-pay | Admitting: Allergy and Immunology

## 2017-04-02 ENCOUNTER — Ambulatory Visit (INDEPENDENT_AMBULATORY_CARE_PROVIDER_SITE_OTHER): Payer: BLUE CROSS/BLUE SHIELD | Admitting: Allergy and Immunology

## 2017-04-02 ENCOUNTER — Encounter: Payer: Self-pay | Admitting: Allergy and Immunology

## 2017-04-02 VITALS — BP 116/66 | HR 60 | Temp 98.2°F | Resp 16 | Ht 66.7 in | Wt 171.4 lb

## 2017-04-02 DIAGNOSIS — Z91018 Allergy to other foods: Secondary | ICD-10-CM | POA: Diagnosis not present

## 2017-04-02 DIAGNOSIS — K200 Eosinophilic esophagitis: Secondary | ICD-10-CM | POA: Diagnosis not present

## 2017-04-02 NOTE — Assessment & Plan Note (Addendum)
Eosinophilic esophagitis. Food allergen skin tests today were negative despite a positive histamine control.   Because of high false negative rates with cows milk, this food should be eliminated for 6-8 weeks with monitoring for symptom improvement.   A laboratory order form has been provided for serum specific IgE against food panel.  When lab results have returned the patient will be called with further recommendations and follow up instructions.  Follow-up as recommended with Dr. Paulita Fujita for endoscopic monitoring.

## 2017-04-02 NOTE — Progress Notes (Signed)
New Patient Note  RE: Gabriel Frye MRN: [id number] DOB: 1980/06/14 Date of Office Visit: 04/02/2017  Referring provider: Wenda Low, MD Primary care provider: Wenda Low, MD  Chief Complaint: Other (Eosinophilic esophagitis)   History of present illness: Gabriel Frye is a 37 y.o. male seen today in consultation requested by Wenda Low, MD.  She reports that over the past 5 years he has had guilty swallowing and frequently experienced the sensation that food gets stuck in his throat and esophagus.  This was especially problematic with meat.  The symptoms have progressed over the past year.  He had a barium swallow which was nonconclusive.  Dr. Paulita Fujita made the diagnosis of eosinophilic esophagitis by EGD with biopsies.  Approximately 3 weeks ago he was started on Flovent 220 g, 2 actuations swallowed twice daily.  His symptoms have improved, however he is concerned about using the steroid long-term.  He is here today to assess his food allergy status.   Assessment and plan: Eosinophilic esophagitis Eosinophilic esophagitis. Food allergen skin tests today were negative despite a positive histamine control.   Because of high false negative rates with cows milk, this food should be eliminated for 6-8 weeks with monitoring for symptom improvement.   A laboratory order form has been provided for serum specific IgE against food panel.  When lab results have returned the patient will be called with further recommendations and follow up instructions.  Follow-up as recommended with Dr. Paulita Fujita for endoscopic monitoring.   Diagnostics: Food allergen skin testing: Negative despite a positive histamine control.    Physical examination: Blood pressure 116/66, pulse 60, temperature 98.2 F (36.8 C), temperature source Oral, resp. rate 16, height 5' 6.7" (1.694 m), weight 171 lb 6.4 oz (77.7 kg), SpO2 97 %.  General: Alert, interactive, in no acute distress. Neck: Supple  without lymphadenopathy. Lungs: Clear to auscultation without wheezing, rhonchi or rales. CV: Normal S1, S2 without murmurs. Abdomen: Nondistended, nontender. Skin: Warm and dry, without lesions or rashes. Extremities:  No clubbing, cyanosis or edema. Neuro:   Grossly intact.  Review of systems:  Review of systems negative except as noted in HPI / PMHx or noted below: Review of Systems  Constitutional: Negative.   HENT: Negative.   Eyes: Negative.   Respiratory: Negative.   Cardiovascular: Negative.   Gastrointestinal: Negative.   Genitourinary: Negative.   Musculoskeletal: Negative.   Skin: Negative.   Neurological: Negative.   Endo/Heme/Allergies: Negative.   Psychiatric/Behavioral: Negative.     Past medical history:  Past Medical History:  Diagnosis Date  . Allergic rhinitis   . Atypical nevus   . GERD (gastroesophageal reflux disease)   . Lentigo   . Palpitations   . PVC (premature ventricular contraction)   . Skin lesions     Past surgical history:  Past Surgical History:  Procedure Laterality Date  . APPENDECTOMY    . ESOPHAGOGASTRODUODENOSCOPY (EGD) WITH PROPOFOL N/A 02/04/2017   Procedure: ESOPHAGOGASTRODUODENOSCOPY (EGD) WITH PROPOFOL;  Surgeon: Arta Silence, MD;  Location: [facility];  Service: Endoscopy;  Laterality: N/A;  . NASAL ENDOSCOPY    . septorphinoplasty    . WISDOM TOOTH EXTRACTION      Family history: Family History  Problem Relation Age of Onset  . Food Allergy Mother   . Cancer - Colon Father 62  . Epilepsy Father   . Heart attack Maternal Grandfather 22       D/C  . CAD Maternal Grandfather   . Heart disease Paternal  Grandfather   . CAD Paternal Grandfather   . Allergic rhinitis Neg Hx   . Asthma Neg Hx   . Angioedema Neg Hx   . Eczema Neg Hx   . Immunodeficiency Neg Hx   . Urticaria Neg Hx     Social history: Social History   Socioeconomic History  . Marital status: Married    Spouse name: Not on file  . Number  of children: Not on file  . Years of education: Not on file  . Highest education level: Not on file  Social Needs  . Financial resource strain: Not on file  . Food insecurity - worry: Not on file  . Food insecurity - inability: Not on file  . Transportation needs - medical: Not on file  . Transportation needs - non-medical: Not on file  Occupational History  . Not on file  Tobacco Use  . Smoking status: Never Smoker  . Smokeless tobacco: Never Used  Substance and Sexual Activity  . Alcohol use: Yes    Comment: once weekly  . Drug use: No  . Sexual activity: Yes  Other Topics Concern  . Not on file  Social History Narrative  . Not on file   Environmental History: The patient lives in a 39 year old house with carpeting in the bedroom, gas heat, and central air.  There are 2 dogs in the home which do not have access to his bedroom.  He is a non-smoker.  There is no known mold/water damage in the home.  Allergies as of 04/02/2017   No Known Allergies     Medication List        Accurate as of 04/02/17  1:41 PM. Always use your most recent med list.          FLOVENT HFA 220 MCG/ACT inhaler Generic drug:  fluticasone Place 2 sprays into both nostrils 2 (two) times daily.   ibuprofen 200 MG tablet Commonly known as:  ADVIL,MOTRIN Take 400-600 mg by mouth every 6 (six) hours as needed for headache or moderate pain.   nebivolol 5 MG tablet Commonly known as:  BYSTOLIC TAKE 1 TABLET BY MOUTH DAILY.       Known medication allergies: No Known Allergies  I appreciate the opportunity to take part in Gabriel Frye's care. Please do not hesitate to contact me with questions.  Sincerely,   R. Edgar Frisk, MD

## 2017-04-02 NOTE — Patient Instructions (Addendum)
Eosinophilic esophagitis Eosinophilic esophagitis. Food allergen skin tests today were negative despite a positive histamine control.   Because of high false negative rates with cows milk, this food should be eliminated for 6-8 weeks with monitoring for symptom improvement.   A laboratory order form has been provided for serum specific IgE against food panel.  When lab results have returned the patient will be called with further recommendations and follow up instructions.  Follow-up as recommended with Dr. Paulita Fujita for endoscopic monitoring.

## 2017-04-05 LAB — FOOD ALLERGY PROFILE
Allergen Corn, IgE: <0.1 kU/L
Clam IgE: <0.1 kU/L
Codfish IgE: <0.1 kU/L
Egg White IgE: 0.21 kU/L — AB
Milk IgE: 0.45 kU/L — AB
Peanut IgE: <0.1 kU/L
Scallop IgE: <0.1 kU/L
Sesame Seed IgE: <0.1 kU/L
Shrimp IgE: <0.1 kU/L
Soybean IgE: <0.1 kU/L
Walnut IgE: <0.1 kU/L
Wheat IgE: 0.16 kU/L — AB

## 2017-04-27 DIAGNOSIS — K200 Eosinophilic esophagitis: Secondary | ICD-10-CM | POA: Diagnosis not present

## 2017-07-07 DIAGNOSIS — M25511 Pain in right shoulder: Secondary | ICD-10-CM | POA: Diagnosis not present

## 2017-08-03 ENCOUNTER — Telehealth: Payer: Self-pay

## 2017-08-03 ENCOUNTER — Other Ambulatory Visit: Payer: Self-pay

## 2017-08-03 DIAGNOSIS — R509 Fever, unspecified: Secondary | ICD-10-CM | POA: Diagnosis not present

## 2017-08-03 DIAGNOSIS — J069 Acute upper respiratory infection, unspecified: Secondary | ICD-10-CM | POA: Diagnosis not present

## 2017-08-03 MED ORDER — NEBIVOLOL HCL 5 MG PO TABS: 5.0000 mg | ORAL_TABLET | Freq: Every day | ORAL | 6 refills | Status: DC

## 2017-08-03 NOTE — Telephone Encounter (Signed)
I attempted to do a Bystolic PA through covermymeds X 3 but was unable as the system did not recognize the pts info. I then called [employer] and did a Bystolic PA over the phone with [place].  Per Sharyn Lull this PA has been approved for the lifetime of the pt unless he switches plans. Reference# [id number]  I have notified the pts pharmacy, [facility].

## 2017-09-12 DIAGNOSIS — M79644 Pain in right finger(s): Secondary | ICD-10-CM | POA: Diagnosis not present

## 2017-11-13 DIAGNOSIS — Z125 Encounter for screening for malignant neoplasm of prostate: Secondary | ICD-10-CM | POA: Diagnosis not present

## 2017-11-13 DIAGNOSIS — Z13220 Encounter for screening for lipoid disorders: Secondary | ICD-10-CM | POA: Diagnosis not present

## 2017-11-13 DIAGNOSIS — Z1389 Encounter for screening for other disorder: Secondary | ICD-10-CM | POA: Diagnosis not present

## 2017-11-13 DIAGNOSIS — Z0000 Encounter for general adult medical examination without abnormal findings: Secondary | ICD-10-CM | POA: Diagnosis not present

## 2017-11-13 DIAGNOSIS — Z23 Encounter for immunization: Secondary | ICD-10-CM | POA: Diagnosis not present

## 2018-03-18 ENCOUNTER — Ambulatory Visit: Payer: BLUE CROSS/BLUE SHIELD | Admitting: Cardiology

## 2018-03-18 ENCOUNTER — Encounter: Payer: Self-pay | Admitting: Cardiology

## 2018-03-18 VITALS — BP 114/74 | HR 58 | Ht 66.7 in | Wt 167.8 lb

## 2018-03-18 DIAGNOSIS — I493 Ventricular premature depolarization: Secondary | ICD-10-CM

## 2018-03-18 DIAGNOSIS — R002 Palpitations: Secondary | ICD-10-CM

## 2018-03-18 NOTE — Progress Notes (Signed)
Cardiology Office Note:    Date:  03/18/2018   ID:  Maralyn Sago Bessey, DOB 1980-09-16, MRN [id number]  PCP:  Wenda Low, MD  Cardiologist:  Candee Furbish, MD  Electrophysiologist:  None   Referring MD: Wenda Low, MD     History of Present Illness:    Gabriel Frye is a 38 y.o. male here for follow-up of palpitations.   exercise treadmill test demonstrated PVCs, echocardiogram showing normal EF, low normal BP. He did not tolerate metoprolol well due to hypotension. Atenolol had a similar effect as well as fatigue. Bystolic 5mg , originally started at 2.5 mg , is helping significantly with symptoms. Palpitations still can occur at the onset of exercise and during recovery but he has not experienced any high-risk symptoms such as syncope during peak exercise.  08/13/11-doing very well. His palpitations have diminished. Noting them at times with heavy exertion. Bystolic 2.5 mg doing well.   09/10/12 - overall has had a set back at the beach, vagal-like, reeling in shark pulling Palpitations, pauses irratic. Felt bad. Dehydrated. ?Hyperven. Always has been under situations like this. At end of ETT, BP tanked and he was symptomatic.   10/13/13-doing well, no complaints. No palpitations. Very rarely feels that with increased stress. Mild increase shortness of breath at times. Overall doing well.   10/27/14-Quad pain, labrum, weight loss. Not feeling it with exercise. PVC when sitting. Overall doing well. About to go on trip to [place].  11/12/15-overall doing well. 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 No significant palpitations. Occasionally cyclical as discussed before. No syncope, no bleeding, no orthopnea, no PND. The proximal with his medication.  03/18/17 -overall doing very well, no significant palpitations.  Like clockwork, sometimes in September or October he may feel a few PVCs.  May be 10 a day.  It is quite rare.  He also had had some dysphasia recently and had endoscopy performed which  showed eosinophilic esophagitis possibly.  He has eliminated soy from his diet and is going to see an allergist.  Overall this is doing okay.  No chest pain fevers chills nausea vomiting syncope.  Going on [place] cruise.  03/18/2018- here for the follow-up of palpitations.  Doing quite well.  No significant symptoms.  He has been off of Bystolic for quite some time.  Doing very well.  He will take this on an as-needed basis currently.  Feels great.  Takes fluticasone occasionally for eosinophilic esophagitis.  Some swallowing difficulty at times.  Overall no chest pain fevers chills nausea vomiting syncope.  Been working out more.  Past Medical History:  Diagnosis Date  . Allergic rhinitis   . Atypical nevus   . GERD (gastroesophageal reflux disease)   . Lentigo   . Palpitations   . PVC (premature ventricular contraction)   . Skin lesions     Past Surgical History:  Procedure Laterality Date  . APPENDECTOMY    . ESOPHAGOGASTRODUODENOSCOPY (EGD) WITH PROPOFOL N/A 02/04/2017   Procedure: ESOPHAGOGASTRODUODENOSCOPY (EGD) WITH PROPOFOL;  Surgeon: Arta Silence, MD;  Location: [facility];  Service: Endoscopy;  Laterality: N/A;  . NASAL ENDOSCOPY    . septorphinoplasty    . WISDOM TOOTH EXTRACTION      Current Medications: Current Meds  Medication Sig  . FLOVENT HFA 220 MCG/ACT inhaler Place 2 sprays into both nostrils 2 (two) times daily as needed.   Marland Kitchen ibuprofen (ADVIL,MOTRIN) 200 MG tablet Take 400-600 mg by mouth every 6 (six) hours as needed for headache or moderate pain.  Allergies:   Patient has no known allergies.   Social History   Socioeconomic History  . Marital status: Married    Spouse name: Not on file  . Number of children: Not on file  . Years of education: Not on file  . Highest education level: Not on file  Occupational History  . Not on file  Social Needs  . Financial resource strain: Not on file  . Food insecurity:    Worry: Not on file     Inability: Not on file  . Transportation needs:    Medical: Not on file    Non-medical: Not on file  Tobacco Use  . Smoking status: Never Smoker  . Smokeless tobacco: Never Used  Substance and Sexual Activity  . Alcohol use: Yes    Comment: once weekly  . Drug use: No  . Sexual activity: Yes  Lifestyle  . Physical activity:    Days per week: Not on file    Minutes per session: Not on file  . Stress: Not on file  Relationships  . Social connections:    Talks on phone: Not on file    Gets together: Not on file    Attends religious service: Not on file    Active member of club or organization: Not on file    Attends meetings of clubs or organizations: Not on file    Relationship status: Not on file  Other Topics Concern  . Not on file  Social History Narrative  . Not on file     Family History: The patient's family history includes CAD in his maternal grandfather and paternal grandfather; Cancer - Colon (age of onset: 44) in his father; Epilepsy in his father; Food Allergy in his mother; Heart attack (age of onset: 21) in his maternal grandfather; Heart disease in his paternal grandfather. There is no history of Allergic rhinitis, Asthma, Angioedema, Eczema, Immunodeficiency, or Urticaria.  ROS:   Please see the history of present illness.     All other systems reviewed and are negative.  EKGs/Labs/Other Studies Reviewed:    The following studies were reviewed today: Prior office notes lab work EKG  EKG:  EKG is  ordered today.  The ekg ordered today demonstrates sinus bradycardia 58 with no other changes personally reviewed and interpreted.  Recent Labs: No results found for requested labs within last 8760 hours.  Recent Lipid Panel No results found for: CHOL, TRIG, HDL, CHOLHDL, VLDL, LDLCALC, LDLDIRECT  Physical Exam:    VS:  BP 114/74   Pulse (!) 58   Ht 5' 6.7" (1.694 m)   Wt 167 lb 12.8 oz (76.1 kg)   BMI 26.52 kg/m     Wt Readings from Last 3 Encounters:   03/18/18 167 lb 12.8 oz (76.1 kg)  04/02/17 171 lb 6.4 oz (77.7 kg)  03/18/17 169 lb (76.7 kg)     GEN:  Well nourished, well developed in no acute distress HEENT: Normal NECK: No JVD; No carotid bruits LYMPHATICS: No lymphadenopathy CARDIAC: RRR, no murmurs, rubs, gallops RESPIRATORY:  Clear to auscultation without rales, wheezing or rhonchi  ABDOMEN: Soft, non-tender, non-distended MUSCULOSKELETAL:  No edema; No deformity  SKIN: Warm and dry NEUROLOGIC:  Alert and oriented x 3 PSYCHIATRIC:  Normal affect   ASSESSMENT:    1. Palpitations   2. PVC's (premature ventricular contractions)    PLAN:    In order of problems listed above:  Palpitations -Doing very well.  Exercising more.  Has not noticed  many problems.  Taking Bystolic only on a as needed basis.  Blood pressure has been excellent.  Doing great.   Medication Adjustments/Labs and Tests Ordered: Current medicines are reviewed at length with the patient today.  Concerns regarding medicines are outlined above.  Orders Placed This Encounter  Procedures  . EKG 12-Lead   No orders of the defined types were placed in this encounter.   Patient Instructions  Medication Instructions:  The current medical regimen is effective;  continue present plan and medications.  If you need a refill on your cardiac medications before your next appointment, please call your pharmacy.   Follow-Up: At [facility], you and your health needs are our priority.  As part of our continuing mission to provide you with exceptional heart care, we have created designated Provider Care Teams.  These Care Teams include your primary Cardiologist (physician) and Advanced Practice Providers (APPs -  Physician Assistants and Nurse Practitioners) who all work together to provide you with the care you need, when you need it. You will need a follow up appointment in 2 years.  Please call our office 2 months in advance to schedule this appointment.   You may see Candee Furbish, MD or one of the following Advanced Practice Providers on your designated Care Team:   Truitt Merle, NP Cecilie Kicks, NP . Kathyrn Drown, NP  Thank you for choosing [facility]!!        Signed, Candee Furbish, MD  03/18/2018 10:31 AM    [facility]

## 2018-03-18 NOTE — Patient Instructions (Signed)
Medication Instructions:  The current medical regimen is effective;  continue present plan and medications.  If you need a refill on your cardiac medications before your next appointment, please call your pharmacy.   Follow-Up: At [facility], you and your health needs are our priority.  As part of our continuing mission to provide you with exceptional heart care, we have created designated Provider Care Teams.  These Care Teams include your primary Cardiologist (physician) and Advanced Practice Providers (APPs -  Physician Assistants and Nurse Practitioners) who all work together to provide you with the care you need, when you need it. You will need a follow up appointment in 2 years.  Please call our office 2 months in advance to schedule this appointment.  You may see Candee Furbish, MD or one of the following Advanced Practice Providers on your designated Care Team:   Truitt Merle, NP Cecilie Kicks, NP . Kathyrn Drown, NP  Thank you for choosing [facility]!!

## 2018-05-03 DIAGNOSIS — M25572 Pain in left ankle and joints of left foot: Secondary | ICD-10-CM | POA: Diagnosis not present

## 2018-09-07 DIAGNOSIS — M25572 Pain in left ankle and joints of left foot: Secondary | ICD-10-CM | POA: Diagnosis not present

## 2018-09-22 ENCOUNTER — Ambulatory Visit: Payer: BC Managed Care – PPO | Attending: Sports Medicine | Admitting: Physical Therapy

## 2018-09-22 ENCOUNTER — Other Ambulatory Visit: Payer: Self-pay

## 2018-09-22 ENCOUNTER — Encounter: Payer: Self-pay | Admitting: Physical Therapy

## 2018-09-22 DIAGNOSIS — M25572 Pain in left ankle and joints of left foot: Secondary | ICD-10-CM | POA: Diagnosis not present

## 2018-09-22 DIAGNOSIS — M25672 Stiffness of left ankle, not elsewhere classified: Secondary | ICD-10-CM | POA: Diagnosis not present

## 2018-09-22 NOTE — Therapy (Signed)
[place], [place], [address] Phone: [phone]   Fax:  [phone]  Physical Therapy Evaluation  Patient Details  Name: Gabriel Frye MRN: [id number] Date of Birth: 03/13/1980 Referring Provider (PT): kendall   Encounter Date: 09/22/2018  PT End of Session - 09/22/18 1157    Visit Number  1    Date for PT Re-Evaluation  11/22/18    PT Start Time  3212    PT Stop Time  1055    PT Time Calculation (min)  40 min    Activity Tolerance  Patient tolerated treatment well    Behavior During Therapy  [facility] for tasks assessed/performed       Past Medical History:  Diagnosis Date  . Allergic rhinitis   . Atypical nevus   . GERD (gastroesophageal reflux disease)   . Lentigo   . Palpitations   . PVC (premature ventricular contraction)   . Skin lesions     Past Surgical History:  Procedure Laterality Date  . APPENDECTOMY    . ESOPHAGOGASTRODUODENOSCOPY (EGD) WITH PROPOFOL N/A 02/04/2017   Procedure: ESOPHAGOGASTRODUODENOSCOPY (EGD) WITH PROPOFOL;  Surgeon: Arta Silence, MD;  Location: [facility];  Service: Endoscopy;  Laterality: N/A;  . NASAL ENDOSCOPY    . septorphinoplasty    . WISDOM TOOTH EXTRACTION      There were no vitals filed for this visit.   Subjective Assessment - 09/22/18 1009    Subjective  Patient reports that in March he was playing football with his daughter and jumped up and landed on the curb and sustained a serious left lateral ankle sprain, he was in a boot for 8 weeks moslty, then used an ASO brace for a while, reports that he has had difficulty with stability, flexibility, difficulty with SLS.    Diagnostic tests  x-rays negative    Patient Stated Goals  be normal, run, jump, soccer    Currently in Pain?  Yes    Pain Score  2     Pain Location  Ankle    Pain Orientation  Left    Pain Descriptors / Indicators  Tightness;Sore    Pain Type  Acute pain    Pain  Onset  More than a month ago    Pain Frequency  Intermittent    Aggravating Factors   single leg stance, has not tried to jump or run or cut    Pain Relieving Factors  pain can be 0/10 at rest and normal activites    Effect of Pain on Daily Activities  fear, unable to do recreation sports         [facility] PT Assessment - 09/22/18 0001      Assessment   Medical Diagnosis  left lateral ankle sprain    Referring Provider (PT)  kendall    Onset Date/Surgical Date  05/04/18    Prior Therapy  no      Precautions   Precautions  None      Balance Screen   Has the patient fallen in the past 6 months  No    Has the patient had a decrease in activity level because of a fear of falling?   No    Is the patient reluctant to leave their home because of a fear of falling?   No      Home Environment   Additional Comments  stairs, does some yardwork      Prior Function  Level of Independence  Independent    Vocation  Full time employment    Licensed conveyancer, mostly sitting    Leisure  soccer, running, racquestball      ROM / Strength   AROM / PROM / Strength  AROM;Strength      AROM   AROM Assessment Site  Ankle    Right/Left Ankle  Left    Left Ankle Dorsiflexion  5    Left Ankle Plantar Flexion  40    Left Ankle Inversion  20    Left Ankle Eversion  5      Strength   Overall Strength Comments  Left ankle strength 4+/5 with some pain with eversion at the lateral malleolous      Palpation   Palpation comment  mild tenderness in the left lateral area and the anterior area      Ambulation/Gait   Gait Comments  gait is mildy antalgic seems to be mostly at toe off on the left, has a little hop when descdning stairs step over step to avoid the end of DF.  Standing on the left he can do, but when I had him on a dynamic surface this was much more difficult                Objective measurements completed on examination: See above findings.               PT Education - 09/22/18 1155    Education Details  HEP for DF and eversion with Tband, gastroc and soleus stretches and SLS to work on Scientist, research (medical)) Educated  Patient    Methods  Explanation;Demonstration;Tactile cues;Verbal cues;Handout    Comprehension  Verbalized understanding          PT Long Term Goals - 09/22/18 1203      PT LONG TERM GOAL #1   Title  independent HEP    Time  8    Period  Weeks    Status  New      PT LONG TERM GOAL #2   Title  increase AROM of the left ankle to WNL's    Time  8    Period  Weeks    Status  New      PT LONG TERM GOAL #3   Title  run and cut without fear and iwthout pain    Time  8    Period  Weeks    Status  New      PT LONG TERM GOAL #4   Title  strength of the left ankle WNL's    Time  8    Period  Weeks    Status  New             Plan - 09/22/18 1157    Clinical Impression Statement  Patient with a severe left lateral ankle sprain in mid March, her wore a walking boot for about 8 weeks due to pain with weight bearing and instability.  He reports that he has improved a lot over the past month.  He reports that he still feels weak, tight and has fear of progresing to running and jumping.  He is tight in the gastroc and especially the soleus, has weakness for eversion, difficulty standing on the left leg only    Stability/Clinical Decision Making  Stable/Uncomplicated    Clinical Decision Making  Low    Rehab Potential  Good    PT Frequency  1x /  week    PT Duration  8 weeks    PT Treatment/Interventions  ADLs/Self Care Home Management;Cryotherapy;Electrical Stimulation;Iontophoresis 4mg /ml Dexamethasone;Therapeutic activities;Functional mobility training;Stair training;Gait training;Therapeutic exercise;Balance training;Neuromuscular re-education;Patient/family education;Manual techniques;Vasopneumatic Device    PT Next Visit Plan  patient is going to try todays HEP and then we will see  him in two weeks    Consulted and Agree with Plan of Care  Patient       Patient will benefit from skilled therapeutic intervention in order to improve the following deficits and impairments:  Abnormal gait, Decreased range of motion, Decreased strength, Decreased balance, Difficulty walking, Increased edema, Impaired flexibility, Pain  Visit Diagnosis: 1. Stiffness of left ankle, not elsewhere classified   2. Pain in left ankle and joints of left foot        Problem List Patient Active Problem List   Diagnosis Date Noted  . Eosinophilic esophagitis 27/08/8673  . History of food allergy 04/02/2017  . PVC's (premature ventricular contractions) 10/27/2014  . GERD (gastroesophageal reflux disease)   . Lentigo   . Atypical nevus   . Skin lesions   . Palpitations   . Allergic rhinitis   . NEVUS, ATYPICAL 12/08/2007  . LENTIGO 12/08/2007  . SKIN LESIONS, MULTIPLE 12/08/2007    Sumner Boast., PT 09/22/2018, 12:05 PM  [address] [place], [place], [address] Phone: [phone]   Fax:  [phone]  Name: Gabriel Frye MRN: [id number] Date of Birth: 01-Jul-1980

## 2018-10-06 ENCOUNTER — Other Ambulatory Visit: Payer: Self-pay

## 2018-10-06 ENCOUNTER — Ambulatory Visit: Payer: BC Managed Care – PPO | Admitting: Physical Therapy

## 2018-10-06 ENCOUNTER — Encounter: Payer: Self-pay | Admitting: Physical Therapy

## 2018-10-06 DIAGNOSIS — M25572 Pain in left ankle and joints of left foot: Secondary | ICD-10-CM

## 2018-10-06 DIAGNOSIS — M25672 Stiffness of left ankle, not elsewhere classified: Secondary | ICD-10-CM | POA: Diagnosis not present

## 2018-10-06 NOTE — Therapy (Signed)
[place], [place], [address] Phone: [phone]   Fax:  [phone]  Physical Therapy Treatment  Patient Details  Name: Gabriel Frye MRN: [id number] Date of Birth: 1980/07/18 Referring Provider (PT): kendall   Encounter Date: 10/06/2018  PT End of Session - 10/06/18 1100    Visit Number  2    Date for PT Re-Evaluation  11/22/18    PT Start Time  9169    PT Stop Time  1100    PT Time Calculation (min)  45 min    Activity Tolerance  Patient tolerated treatment well    Behavior During Therapy  [facility] for tasks assessed/performed       Past Medical History:  Diagnosis Date  . Allergic rhinitis   . Atypical nevus   . GERD (gastroesophageal reflux disease)   . Lentigo   . Palpitations   . PVC (premature ventricular contraction)   . Skin lesions     Past Surgical History:  Procedure Laterality Date  . APPENDECTOMY    . ESOPHAGOGASTRODUODENOSCOPY (EGD) WITH PROPOFOL N/A 02/04/2017   Procedure: ESOPHAGOGASTRODUODENOSCOPY (EGD) WITH PROPOFOL;  Surgeon: Arta Silence, MD;  Location: [facility];  Service: Endoscopy;  Laterality: N/A;  . NASAL ENDOSCOPY    . septorphinoplasty    . WISDOM TOOTH EXTRACTION      There were no vitals filed for this visit.  Subjective Assessment - 10/06/18 1017    Subjective  Pt reports that his ankle is getting better over all, jumped in the pool Saturday and landing on L ankle causing a jolt, pain lasted only 4 minutes.    Currently in Pain?  No/denies    Pain Score  0-No pain                       [facility] Adult PT Treatment/Exercise - 10/06/18 0001      High Level Balance   High Level Balance Comments  three way LLE rebound ball toss x 10 each with and without airex, LLE on airex with cone taps 3x3      Exercises   Exercises  Ankle      Ankle Exercises: Aerobic   Elliptical  I10 R 5 x 3 min     Recumbent Bike  L2 x5 min      Ankle  Exercises: Machines for Strengthening   Cybex Leg Press  LLE heel raises 30lb 3x15       Ankle Exercises: Plyometrics   Plyometric Exercises  Lateral hops with vectors 2x3       Ankle Exercises: Standing   Other Standing Ankle Exercises  Resisted gait 70lb 4 way x 3 each, Toe and heel raises with balck bar 2x15 each     Other Standing Ankle Exercises  Squats on Dynax disk 2x10, Walking lunges                   PT Long Term Goals - 10/06/18 1100      PT LONG TERM GOAL #1   Title  independent HEP    Status  Achieved      PT LONG TERM GOAL #2   Title  increase AROM of the left ankle to WNL's    Status  On-going      PT LONG TERM GOAL #3   Title  run and cut without fear and iwthout pain    Status  On-going  PT LONG TERM GOAL #4   Title  strength of the left ankle WNL's    Status  Partially Met            Plan - 10/06/18 1101    Clinical Impression Statement  Pt did really well with today's activities. He is very high level and reports overall improvement. He only voices concern of doing too much too soon. He did report some tightness in the L achillis area with lateral hops. Some mild inflammation in L ankle compared to R.    Stability/Clinical Decision Making  Stable/Uncomplicated    Rehab Potential  Good    PT Frequency  1x / week    PT Duration  8 weeks    PT Treatment/Interventions  ADLs/Self Care Home Management;Cryotherapy;Electrical Stimulation;Iontophoresis 4mg/ml Dexamethasone;Therapeutic activities;Functional mobility training;Stair training;Gait training;Therapeutic exercise;Balance training;Neuromuscular re-education;Patient/family education;Manual techniques;Vasopneumatic Device    PT Next Visit Plan  Pt is doing well overall managing his own care, reports that he will call if he needs anything       Patient will benefit from skilled therapeutic intervention in order to improve the following deficits and impairments:  Abnormal gait, Decreased  range of motion, Decreased strength, Decreased balance, Difficulty walking, Increased edema, Impaired flexibility, Pain  Visit Diagnosis: 1. Pain in left ankle and joints of left foot   2. Stiffness of left ankle, not elsewhere classified        Problem List Patient Active Problem List   Diagnosis Date Noted  . Eosinophilic esophagitis 04/02/2017  . History of food allergy 04/02/2017  . PVC's (premature ventricular contractions) 10/27/2014  . GERD (gastroesophageal reflux disease)   . Lentigo   . Atypical nevus   . Skin lesions   . Palpitations   . Allergic rhinitis   . NEVUS, ATYPICAL 12/08/2007  . LENTIGO 12/08/2007  . SKIN LESIONS, MULTIPLE 12/08/2007    Gabriel Frye, PTA 10/06/2018, 11:04 AM  [facility] [address] , [place], [address] Phone: [phone]   Fax:  [phone]  Name: Gabriel Frye MRN: [id number] Date of Birth: 04/27/1980   

## 2018-11-20 DIAGNOSIS — Z20828 Contact with and (suspected) exposure to other viral communicable diseases: Secondary | ICD-10-CM | POA: Diagnosis not present

## 2018-12-16 IMAGING — RF DG UGI W/ HIGH DENSITY W/KUB
6 series · 14 of 24 positions shown · non-contrast
Comparison: None in PACs

CLINICAL DATA: Intermittent dysphagia for least the past 2 years
with some increase in symptoms more recently.

EXAM:
UPPER GI SERIES WITH KUB
TECHNIQUE: After obtaining a scout radiograph a routine upper GI series was
performed using thin and high density barium. Effervescent crystals
and a barium tablet were administered.
FLUOROSCOPY TIME:  Fluoroscopy Time:  1 minutes, 5 seconds
Radiation Exposure Index (if provided by the fluoroscopic device):
337 mGy
Number of Acquired Spot Images: 13+ 1 video loop.

[Series 1: one shot · 1 of 1 slices shown (1 of 3)]
[im 1/1]
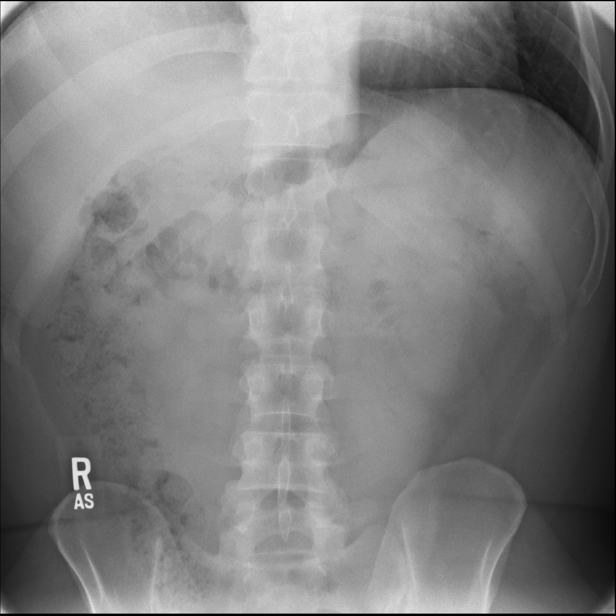

[Series 2: sequence · 2 of 71 frames shown (1 of 3)]
[frame 36/71]
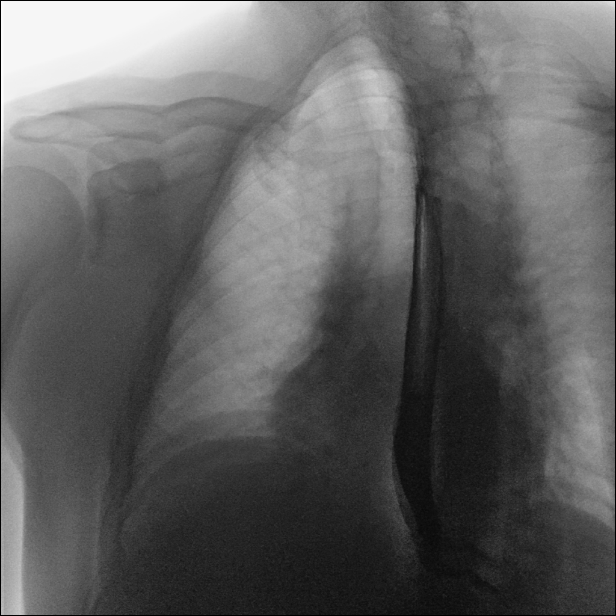
[frame 61/71]
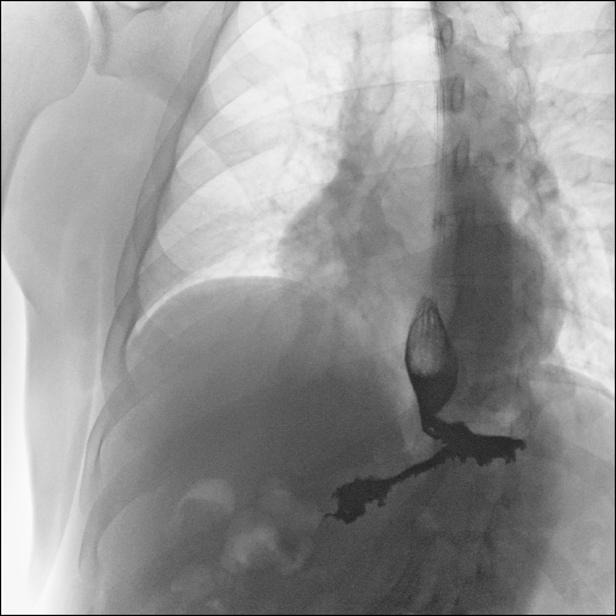

[Series 3: one shot · 1 of 2 slices shown (2 of 3)]
[im 2/2]
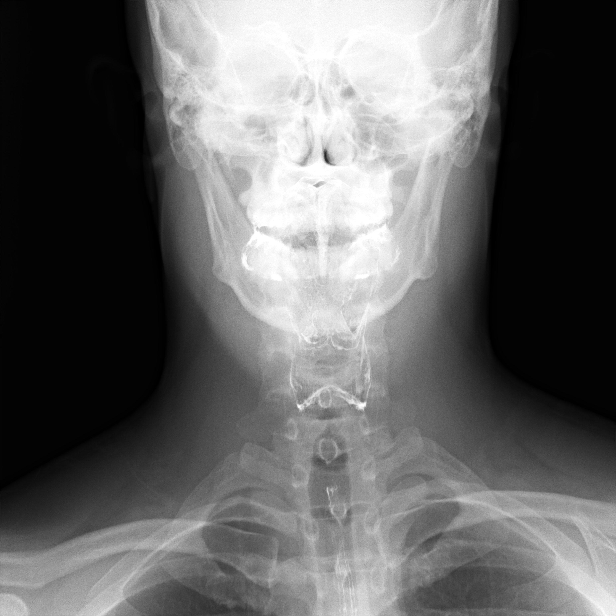

[Series 4: sequence · 2 of 81 frames shown (2 of 3)]
[frame 13/81]
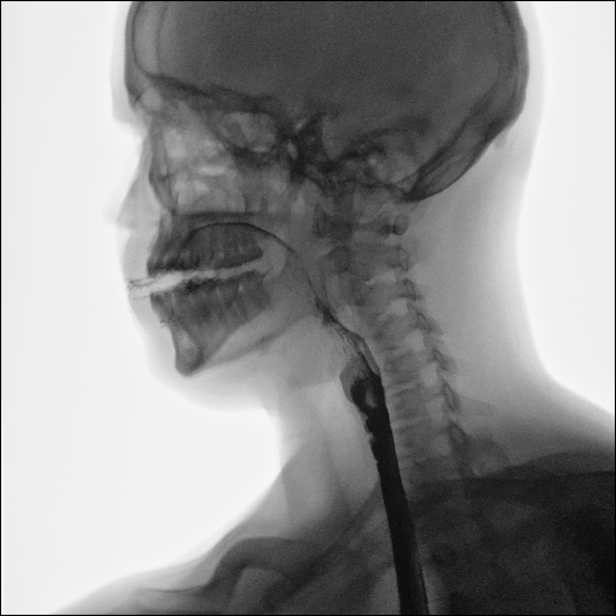
[frame 41/81]
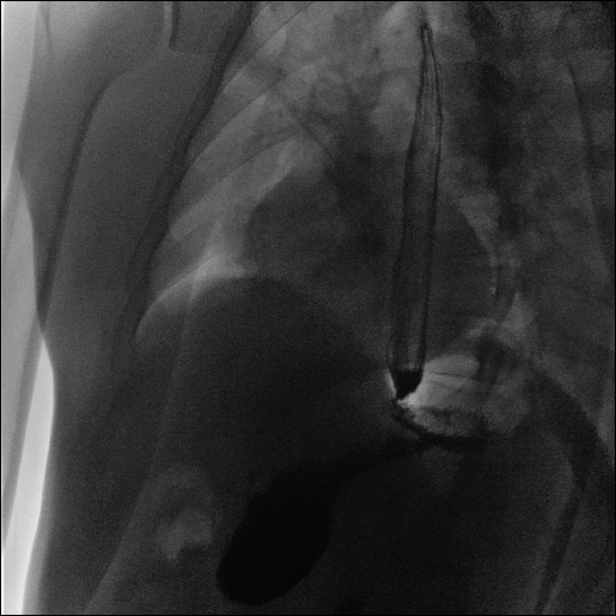

[Series 5: one shot · 6 of 10 slices shown (3 of 3)]
[im 1/10]
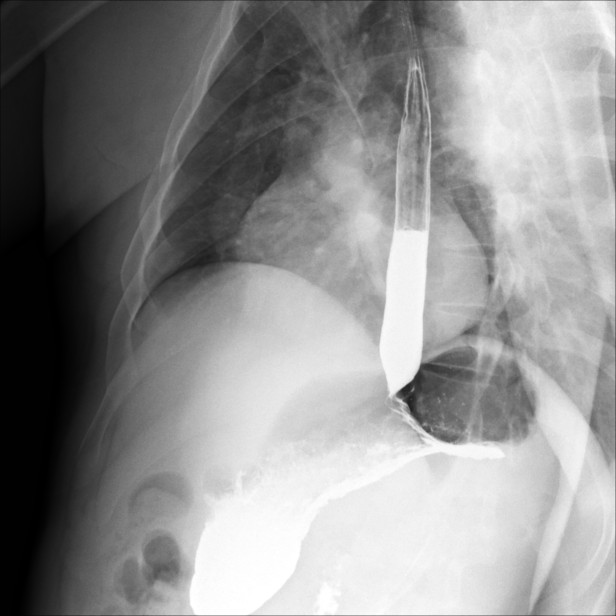
[im 3/10]
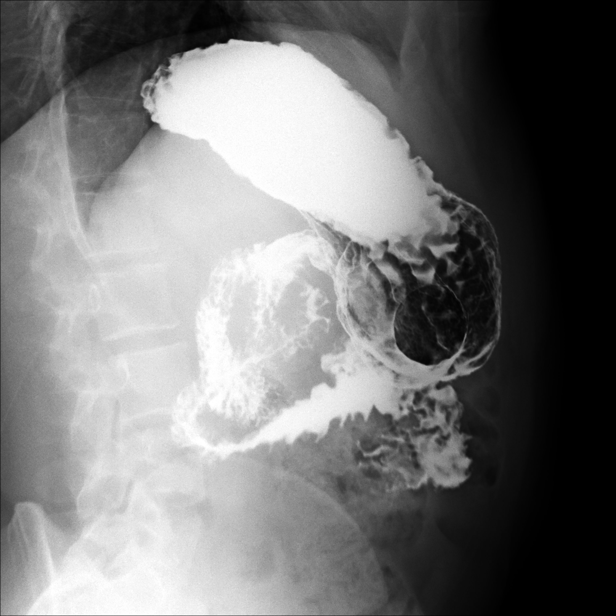
[im 5/10]
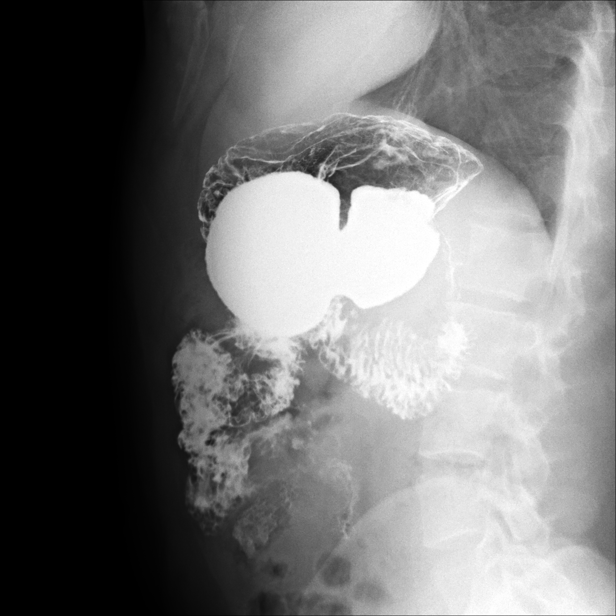
[im 7/10]
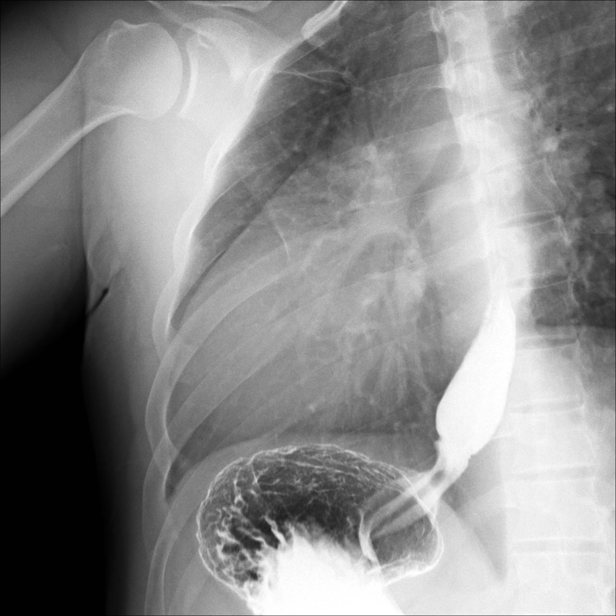
[im 9/10]
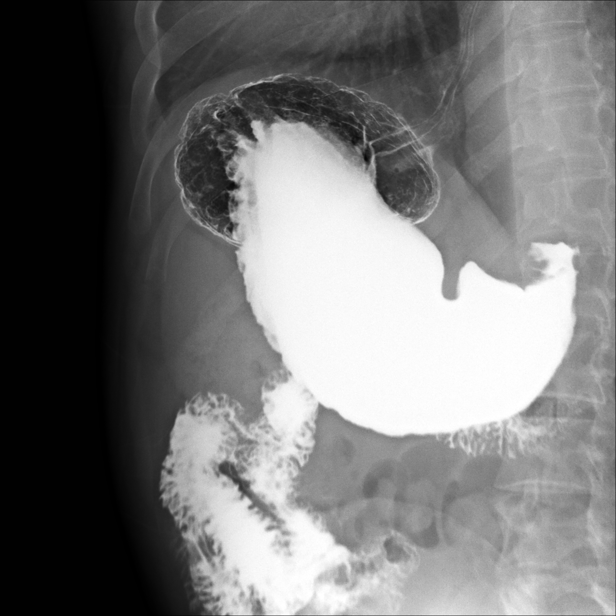
[im 10/10]
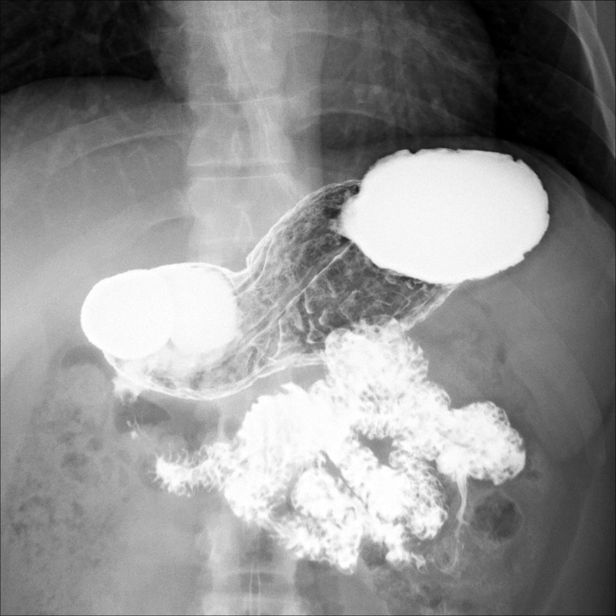

[Series 6: sequence · 2 of 74 frames shown (3 of 3)]
[frame 38/74]
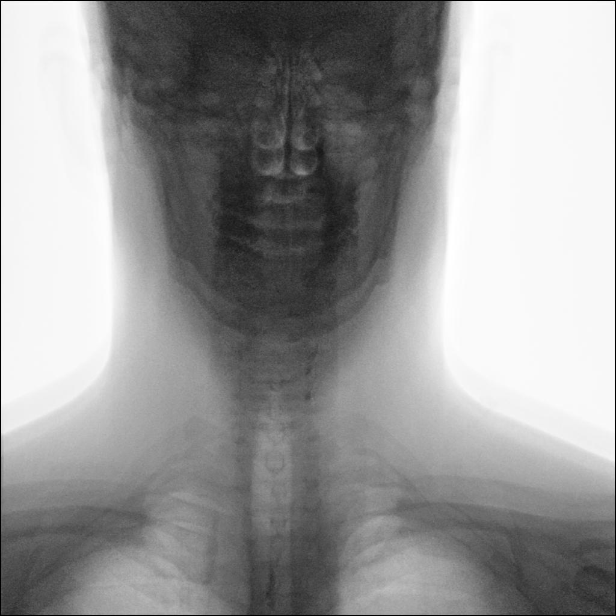
[frame 66/74]
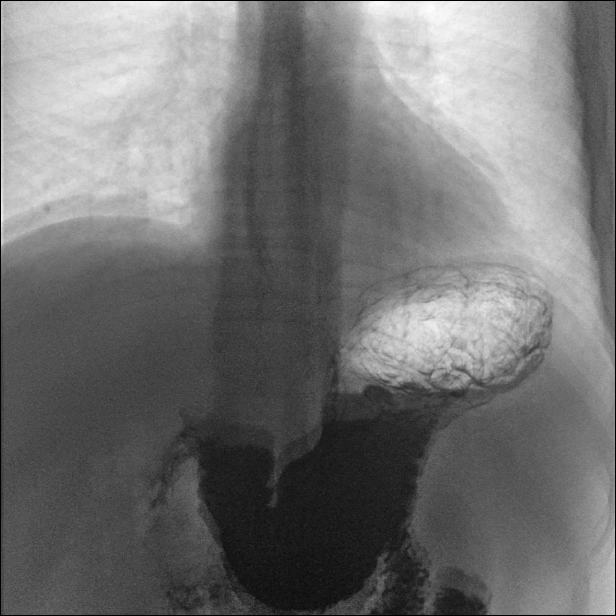

[14 of 24 positions shown; findings below may reference images not displayed]

FINDINGS: The scout film reveals a normal bowel gas pattern. No abnormal soft
tissue calcifications are observed.

The patient ingested thick and thin barium in the effervescent
crystals without difficulty. The hypopharynx distended well. There
was no laryngeal penetration of the barium. The cricopharyngeus
muscle did not appear prominent. No fixed stricture of the thoracic
esophagus was observed. Tiny mucosal irregularities in the anterior
aspect of the proximal cervical esophagus could reflect tiny webs.
There was no delay in passage of the barium tablet however. There
was a tiny intermittently observed reducible hiatal hernia. No
reflux was observed.

The stomach distended well. The mucosal fold pattern was normal.
There was no evidence of an ulcer niche. Gastric emptying was
prompt. The duodenal bulb and C-sweep were normal in appearance.
IMPRESSION: Normal esophageal motility. Mild irregularity of the mucosa of the
proximal cervical esophagus which could reflect presence of tiny
webs not producing obstruction to liquid barium or the barium
tablet. No evidence of esophagitis.

Small reducible hiatal hernia without evidence of gastroesophageal
reflux.

Normal appearance of the stomach and duodenum.

Direct visualization is recommended.

## 2019-01-19 DIAGNOSIS — Z20828 Contact with and (suspected) exposure to other viral communicable diseases: Secondary | ICD-10-CM | POA: Diagnosis not present

## 2019-02-17 DIAGNOSIS — Z125 Encounter for screening for malignant neoplasm of prostate: Secondary | ICD-10-CM | POA: Diagnosis not present

## 2019-02-17 DIAGNOSIS — Z23 Encounter for immunization: Secondary | ICD-10-CM | POA: Diagnosis not present

## 2019-02-17 DIAGNOSIS — Z0000 Encounter for general adult medical examination without abnormal findings: Secondary | ICD-10-CM | POA: Diagnosis not present

## 2019-02-17 DIAGNOSIS — Z1389 Encounter for screening for other disorder: Secondary | ICD-10-CM | POA: Diagnosis not present

## 2019-06-07 DIAGNOSIS — D2262 Melanocytic nevi of left upper limb, including shoulder: Secondary | ICD-10-CM | POA: Diagnosis not present

## 2019-06-07 DIAGNOSIS — D225 Melanocytic nevi of trunk: Secondary | ICD-10-CM | POA: Diagnosis not present

## 2019-06-07 DIAGNOSIS — L821 Other seborrheic keratosis: Secondary | ICD-10-CM | POA: Diagnosis not present

## 2019-06-07 DIAGNOSIS — D2261 Melanocytic nevi of right upper limb, including shoulder: Secondary | ICD-10-CM | POA: Diagnosis not present

## 2019-06-07 DIAGNOSIS — D2362 Other benign neoplasm of skin of left upper limb, including shoulder: Secondary | ICD-10-CM | POA: Diagnosis not present

## 2019-06-07 DIAGNOSIS — D485 Neoplasm of uncertain behavior of skin: Secondary | ICD-10-CM | POA: Diagnosis not present

## 2019-12-01 DIAGNOSIS — L988 Other specified disorders of the skin and subcutaneous tissue: Secondary | ICD-10-CM | POA: Diagnosis not present

## 2019-12-01 DIAGNOSIS — D485 Neoplasm of uncertain behavior of skin: Secondary | ICD-10-CM | POA: Diagnosis not present

## 2022-11-01 ENCOUNTER — Other Ambulatory Visit (HOSPITAL_BASED_OUTPATIENT_CLINIC_OR_DEPARTMENT_OTHER): Payer: Self-pay

## 2022-11-05 ENCOUNTER — Other Ambulatory Visit (HOSPITAL_BASED_OUTPATIENT_CLINIC_OR_DEPARTMENT_OTHER): Payer: Self-pay

## 2022-11-06 ENCOUNTER — Other Ambulatory Visit (HOSPITAL_BASED_OUTPATIENT_CLINIC_OR_DEPARTMENT_OTHER): Payer: Self-pay

## 2022-11-06 MED ORDER — MELOXICAM 15 MG PO TABS
15.0000 mg | ORAL_TABLET | Freq: Every day | ORAL | 5 refills | Status: AC | PRN
  Filled 2022-11-06: qty 30, 30d supply, fill #0

## 2022-11-18 ENCOUNTER — Other Ambulatory Visit (HOSPITAL_BASED_OUTPATIENT_CLINIC_OR_DEPARTMENT_OTHER): Payer: Self-pay

## 2022-11-19 ENCOUNTER — Other Ambulatory Visit (HOSPITAL_BASED_OUTPATIENT_CLINIC_OR_DEPARTMENT_OTHER): Payer: Self-pay

## 2022-11-19 MED ORDER — MUPIROCIN 2 % EX OINT
1.0000 | TOPICAL_OINTMENT | Freq: Every day | CUTANEOUS | 0 refills | Status: AC
  Filled 2022-11-19: qty 22, 14d supply, fill #0

## 2023-03-19 ENCOUNTER — Other Ambulatory Visit (HOSPITAL_BASED_OUTPATIENT_CLINIC_OR_DEPARTMENT_OTHER): Payer: Self-pay

## 2023-03-19 MED ORDER — GABAPENTIN 100 MG PO CAPS
100.0000 mg | ORAL_CAPSULE | ORAL | 0 refills | Status: AC
  Filled 2023-03-19: qty 30, 10d supply, fill #0

## 2023-03-19 MED ORDER — VALACYCLOVIR HCL 1 G PO TABS
1000.0000 mg | ORAL_TABLET | Freq: Two times a day (BID) | ORAL | 0 refills | Status: AC
  Filled 2023-03-19: qty 20, 10d supply, fill #0

## 2023-05-01 ENCOUNTER — Other Ambulatory Visit (HOSPITAL_BASED_OUTPATIENT_CLINIC_OR_DEPARTMENT_OTHER): Payer: Self-pay

## 2023-05-01 MED ORDER — FAMOTIDINE 20 MG PO TABS
20.0000 mg | ORAL_TABLET | Freq: Every evening | ORAL | 5 refills | Status: AC | PRN
  Filled 2023-05-01: qty 30, 30d supply, fill #0

## 2023-05-11 ENCOUNTER — Other Ambulatory Visit (HOSPITAL_BASED_OUTPATIENT_CLINIC_OR_DEPARTMENT_OTHER): Payer: Self-pay

## 2023-05-26 ENCOUNTER — Other Ambulatory Visit (HOSPITAL_BASED_OUTPATIENT_CLINIC_OR_DEPARTMENT_OTHER): Payer: Self-pay

## 2023-09-01 ENCOUNTER — Other Ambulatory Visit (HOSPITAL_BASED_OUTPATIENT_CLINIC_OR_DEPARTMENT_OTHER): Payer: Self-pay

## 2023-09-01 MED ORDER — PREDNISONE 10 MG PO TABS
10.0000 mg | ORAL_TABLET | Freq: Three times a day (TID) | ORAL | 0 refills | Status: AC
  Filled 2023-09-01: qty 90, 30d supply, fill #0

## 2023-09-14 ENCOUNTER — Other Ambulatory Visit (HOSPITAL_BASED_OUTPATIENT_CLINIC_OR_DEPARTMENT_OTHER): Payer: Self-pay

## 2023-09-14 ENCOUNTER — Other Ambulatory Visit (HOSPITAL_COMMUNITY): Payer: Self-pay

## 2023-09-14 MED ORDER — PREDNISONE 50 MG PO TABS
50.0000 mg | ORAL_TABLET | Freq: Every day | ORAL | 0 refills | Status: AC | PRN
  Filled 2023-09-14 (×3): qty 14, 14d supply, fill #0

## 2023-09-15 ENCOUNTER — Other Ambulatory Visit (HOSPITAL_COMMUNITY): Payer: Self-pay
